# Patient Record
Sex: Male | Born: 1945 | Race: White | Hispanic: No | Smoking: Former smoker
Health system: Southern US, Community
[De-identification: ages and names within clinical notes are randomized; demographics above are authoritative.]

## PROBLEM LIST (undated history)

## (undated) DIAGNOSIS — C439 Malignant melanoma of skin, unspecified: Secondary | ICD-10-CM

## (undated) DIAGNOSIS — Z952 Presence of prosthetic heart valve: Secondary | ICD-10-CM

## (undated) DIAGNOSIS — G4733 Obstructive sleep apnea (adult) (pediatric): Secondary | ICD-10-CM

## (undated) DIAGNOSIS — J449 Chronic obstructive pulmonary disease, unspecified: Secondary | ICD-10-CM

## (undated) DIAGNOSIS — K55069 Acute infarction of intestine, part and extent unspecified: Secondary | ICD-10-CM

## (undated) DIAGNOSIS — I2699 Other pulmonary embolism without acute cor pulmonale: Secondary | ICD-10-CM

## (undated) DIAGNOSIS — K219 Gastro-esophageal reflux disease without esophagitis: Secondary | ICD-10-CM

## (undated) DIAGNOSIS — E669 Obesity, unspecified: Secondary | ICD-10-CM

## (undated) DIAGNOSIS — E1169 Type 2 diabetes mellitus with other specified complication: Secondary | ICD-10-CM

## (undated) DIAGNOSIS — I10 Essential (primary) hypertension: Secondary | ICD-10-CM

## (undated) HISTORY — PX: CHOLECYSTECTOMY: SHX55

## (undated) HISTORY — PX: OTHER SURGICAL HISTORY: SHX169

## (undated) HISTORY — PX: PILONIDAL CYST EXCISION: SHX744

## (undated) HISTORY — PX: AORTIC VALVE REPLACEMENT: SHX41

## (undated) HISTORY — PX: COLON SURGERY: SHX602

## (undated) HISTORY — PX: ARTERIAL THROMBECTOMY: SHX558

---

## 2010-11-01 ENCOUNTER — Emergency Department (HOSPITAL_BASED_OUTPATIENT_CLINIC_OR_DEPARTMENT_OTHER)
Admission: EM | Admit: 2010-11-01 | Discharge: 2010-11-01 | Payer: Self-pay | Source: Home / Self Care | Admitting: Emergency Medicine

## 2017-12-26 DIAGNOSIS — K55069 Acute infarction of intestine, part and extent unspecified: Secondary | ICD-10-CM

## 2017-12-26 HISTORY — DX: Acute infarction of intestine, part and extent unspecified: K55.069

## 2018-01-28 ENCOUNTER — Observation Stay (HOSPITAL_COMMUNITY)
Admission: EM | Admit: 2018-01-28 | Discharge: 2018-01-31 | Disposition: A | Discharge status: Home / Self Care | Payer: Medicare Other | Attending: Internal Medicine | Admitting: Internal Medicine

## 2018-01-28 ENCOUNTER — Encounter (HOSPITAL_COMMUNITY): Payer: Self-pay

## 2018-01-28 DIAGNOSIS — Z87891 Personal history of nicotine dependence: Secondary | ICD-10-CM | POA: Diagnosis not present

## 2018-01-28 DIAGNOSIS — E46 Unspecified protein-calorie malnutrition: Secondary | ICD-10-CM | POA: Diagnosis not present

## 2018-01-28 DIAGNOSIS — Z7901 Long term (current) use of anticoagulants: Secondary | ICD-10-CM | POA: Insufficient documentation

## 2018-01-28 DIAGNOSIS — G4733 Obstructive sleep apnea (adult) (pediatric): Secondary | ICD-10-CM | POA: Diagnosis not present

## 2018-01-28 DIAGNOSIS — K909 Intestinal malabsorption, unspecified: Secondary | ICD-10-CM | POA: Diagnosis not present

## 2018-01-28 DIAGNOSIS — R531 Weakness: Secondary | ICD-10-CM | POA: Insufficient documentation

## 2018-01-28 DIAGNOSIS — Z79899 Other long term (current) drug therapy: Secondary | ICD-10-CM | POA: Diagnosis not present

## 2018-01-28 DIAGNOSIS — E669 Obesity, unspecified: Secondary | ICD-10-CM

## 2018-01-28 DIAGNOSIS — D649 Anemia, unspecified: Secondary | ICD-10-CM | POA: Diagnosis not present

## 2018-01-28 DIAGNOSIS — K219 Gastro-esophageal reflux disease without esophagitis: Secondary | ICD-10-CM | POA: Insufficient documentation

## 2018-01-28 DIAGNOSIS — I10 Essential (primary) hypertension: Secondary | ICD-10-CM | POA: Insufficient documentation

## 2018-01-28 DIAGNOSIS — Z86718 Personal history of other venous thrombosis and embolism: Secondary | ICD-10-CM | POA: Diagnosis not present

## 2018-01-28 DIAGNOSIS — J449 Chronic obstructive pulmonary disease, unspecified: Secondary | ICD-10-CM | POA: Diagnosis present

## 2018-01-28 DIAGNOSIS — J90 Pleural effusion, not elsewhere classified: Secondary | ICD-10-CM | POA: Insufficient documentation

## 2018-01-28 DIAGNOSIS — Z86711 Personal history of pulmonary embolism: Secondary | ICD-10-CM | POA: Diagnosis not present

## 2018-01-28 DIAGNOSIS — E785 Hyperlipidemia, unspecified: Secondary | ICD-10-CM | POA: Diagnosis not present

## 2018-01-28 DIAGNOSIS — K55069 Acute infarction of intestine, part and extent unspecified: Secondary | ICD-10-CM

## 2018-01-28 DIAGNOSIS — Z952 Presence of prosthetic heart valve: Secondary | ICD-10-CM | POA: Diagnosis not present

## 2018-01-28 DIAGNOSIS — J42 Unspecified chronic bronchitis: Secondary | ICD-10-CM

## 2018-01-28 DIAGNOSIS — Z7982 Long term (current) use of aspirin: Secondary | ICD-10-CM | POA: Diagnosis not present

## 2018-01-28 DIAGNOSIS — Z85820 Personal history of malignant melanoma of skin: Secondary | ICD-10-CM | POA: Insufficient documentation

## 2018-01-28 DIAGNOSIS — E440 Moderate protein-calorie malnutrition: Secondary | ICD-10-CM

## 2018-01-28 DIAGNOSIS — E876 Hypokalemia: Secondary | ICD-10-CM

## 2018-01-28 DIAGNOSIS — E119 Type 2 diabetes mellitus without complications: Secondary | ICD-10-CM | POA: Insufficient documentation

## 2018-01-28 DIAGNOSIS — R262 Difficulty in walking, not elsewhere classified: Secondary | ICD-10-CM | POA: Insufficient documentation

## 2018-01-28 DIAGNOSIS — Z885 Allergy status to narcotic agent status: Secondary | ICD-10-CM | POA: Insufficient documentation

## 2018-01-28 DIAGNOSIS — E1169 Type 2 diabetes mellitus with other specified complication: Secondary | ICD-10-CM | POA: Diagnosis present

## 2018-01-28 DIAGNOSIS — D735 Infarction of spleen: Secondary | ICD-10-CM | POA: Diagnosis not present

## 2018-01-28 HISTORY — DX: Gastro-esophageal reflux disease without esophagitis: K21.9

## 2018-01-28 HISTORY — DX: Acute infarction of intestine, part and extent unspecified: K55.069

## 2018-01-28 HISTORY — DX: Essential (primary) hypertension: I10

## 2018-01-28 HISTORY — DX: Type 2 diabetes mellitus with other specified complication: E11.69

## 2018-01-28 HISTORY — DX: Other pulmonary embolism without acute cor pulmonale: I26.99

## 2018-01-28 HISTORY — DX: Obstructive sleep apnea (adult) (pediatric): G47.33

## 2018-01-28 HISTORY — DX: Obesity, unspecified: E66.9

## 2018-01-28 HISTORY — DX: Presence of prosthetic heart valve: Z95.2

## 2018-01-28 HISTORY — DX: Chronic obstructive pulmonary disease, unspecified: J44.9

## 2018-01-28 HISTORY — DX: Malignant melanoma of skin, unspecified: C43.9

## 2018-01-28 MED ORDER — ONDANSETRON HCL 4 MG/2ML IJ SOLN
4.0000 mg | Freq: Once | INTRAMUSCULAR | Status: AC
  Administered 2018-01-29: 4 mg via INTRAVENOUS
  Filled 2018-01-28: qty 2

## 2018-01-28 NOTE — ED Provider Notes (Signed)
[facility] DEPT Provider Note   CSN: [id number] Arrival date & time: 01/28/18  2259     History   Chief Complaint Chief Complaint  Patient presents with  . Nausea  . lethargic    HPI Lachlan Mckim is a 72 y.o. male.  The history is provided by the patient.  He was discharged from 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  He had been at a nursing home undergoing rehabilitation.  His wife noted some gradual onset of weakness 2 days ago which got significantly worse today.  He is complaining of nausea today.  He has had some chills but no fever or sweats.  He has not had any vomiting.  There has been fairly constant diarrhea since he had been admitted to the hospital, and this is not changed.  He denies any abdominal pain.  Wife is also noted that he has a tremor which seems to have gotten worse.  He is very unsteady when using his walker, and this is clearly worse today.  He had routine lab work done at the nursing home, and was sent here because his calcium came back very low - 6.1.  History reviewed. No pertinent past medical history.  There are no active problems to display for this patient.   History reviewed.  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      Home Medications    Prior to Admission medications   Not on File    Family History History reviewed. No pertinent family history.  Social History Social History   Tobacco Use  . Smoking status: Never Smoker  . Smokeless tobacco: Never Used  Substance Use Topics  . Alcohol use: Never    Frequency: Never  . Drug use: Never     Allergies   Patient has no allergy information on record.   Review of Systems Review of Systems  All other systems reviewed and are negative.    Physical Exam Updated Vital Signs BP 115/69 (BP Location: Left Arm)   Pulse 76   Temp 98.9 F (37.2  C) (Oral)   Resp (!) 33   SpO2 96%   Physical Exam  Nursing note and vitals reviewed.  72 year old male, resting comfortably and in no acute distress. Vital signs are significant for rapid respiratory rate. Oxygen saturation is 96%, which is normal. Head is normocephalic and atraumatic. PERRLA, EOMI. Oropharynx is clear. Neck is nontender and supple without adenopathy or JVD. Back is nontender and there is no CVA tenderness. Lungs are clear without rales, wheezes, or rhonchi. Chest is nontender. Heart has regular rate and rhythm without murmur. Abdomen is soft, flat, nontender without masses or hepatosplenomegaly and peristalsis is normoactive.  Midline surgical scar is present with dressing in place-dressing not removed. Extremities have 2+ edema, full range of motion is present. Skin is warm and dry without rash. Neurologic: Mental status is normal, cranial nerves are intact, there are no motor or sensory deficits.  Mild tremor and mild cogwheel rigidity noted.  Chvostek's sign not present.  ED Treatments / Results  Labs (all labs ordered are listed, but only abnormal results are displayed) Labs Reviewed  COMPREHENSIVE METABOLIC PANEL - Abnormal; Notable for the following components:      Result Value   CO2 19 (*)    Calcium 6.2 (*)    Total Protein 5.3 (*)    Albumin 2.3 (*)  AST 54 (*)    Total Bilirubin 1.3 (*)    All other components within normal limits  CBC WITH DIFFERENTIAL/PLATELET - Abnormal; Notable for the following components:   RBC 3.63 (*)    Hemoglobin 9.9 (*)    HCT 30.7 (*)    RDW 15.6 (*)    All other components within normal limits  MAGNESIUM - Abnormal; Notable for the following components:   Magnesium 0.3 (*)    All other components within normal limits  URINALYSIS, ROUTINE W REFLEX MICROSCOPIC  CALCIUM, IONIZED  MAGNESIUM  CALCIUM  I-STAT CG4 LACTIC ACID, ED  I-STAT CG4 LACTIC ACID, ED   Radiology No results found.  Procedures Procedures    CRITICAL CARE Performed by: Delora Fuel Total critical care time: 45 minutes Critical care time was exclusive of separately billable procedures and treating other patients. Critical care was necessary to treat or prevent imminent or life-threatening deterioration. Critical care was time spent personally by me on the following activities: development of treatment plan with patient and/or surrogate as well as nursing, discussions with consultants, evaluation of patient's response to treatment, examination of patient, obtaining history from patient or surrogate, ordering and performing treatments and interventions, ordering and review of laboratory studies, ordering and review of radiographic studies, pulse oximetry and re-evaluation of patient's condition.  Medications Ordered in ED Medications  iopamidol (ISOVUE-370) 76 % injection (has no administration in time range)  ondansetron (ZOFRAN) injection 4 mg (4 mg Intravenous Given 01/29/18 0034)  magnesium sulfate IVPB 2 g 50 mL (0 g Intravenous Stopped 01/29/18 0309)  magnesium sulfate IVPB 2 g 50 mL (0 g Intravenous Stopped 01/29/18 0443)  iopamidol (ISOVUE-370) 76 % injection 100 mL (100 mLs Intravenous Contrast Given 01/29/18 0700)  calcium gluconate 1 g in sodium chloride 0.9 % 100 mL IVPB (1 g Intravenous New Bag/Given 01/29/18 0725)     Initial Impression / Assessment and Plan / ED Course  I have reviewed the triage vital signs and the nursing notes.  Pertinent labs & imaging results that were available during my care of the patient were reviewed by me and considered in my medical decision making (see chart for details).  Weakness and hypocalcemia.  Old records are reviewed including records from [facility]  He did have hypocalcemia there with calcium of 7.4.  I cannot find report of albumin.  He does not show clinical signs of hypocalcemia, so I suspect that this is related to hypoalbuminemia.  Will check conference of  metabolic panel.  We will also check urinalysis to make sure he does not have occult urinary tract infection to account for his weakness.  Albumin is low at 2.3, which would make his calcium level still slightly low but not dangerously low.  Magnesium is come back extremely low at 0.3.  He is given 2 rounds of intravenous magnesium.  Urinalysis shows no evidence of infection.  He is being sent for CT angiogram which was scheduled to be done at [facility] today.  The scan has been completed, but interpretation is still pending.  He is also getting some IV calcium.  We will repeat calcium and magnesium levels after infusions have been completed.  Case is signed out to Dr. Eulis Foster to evaluate those results.  Patient also states that he would prefer to return home rather than to the nursing care facility.  We will ask case management to help set up home health needs including home rehabilitation sessions.  Final Clinical Impressions(s) /  ED Diagnoses   Final diagnoses:  Hypocalcemia  Hypomagnesemia  Weakness  Normochromic normocytic anemia    ED Discharge Orders    None       Delora Fuel, MD 49/17/91 [phone]

## 2018-01-28 NOTE — ED Triage Notes (Signed)
Pt from a facility and they say that he's been lethargic and had abnormal labs

## 2018-01-29 ENCOUNTER — Other Ambulatory Visit: Payer: Self-pay

## 2018-01-29 ENCOUNTER — Emergency Department (HOSPITAL_COMMUNITY): Payer: Medicare Other

## 2018-01-29 ENCOUNTER — Encounter (HOSPITAL_COMMUNITY): Payer: Self-pay

## 2018-01-29 DIAGNOSIS — K219 Gastro-esophageal reflux disease without esophagitis: Secondary | ICD-10-CM | POA: Insufficient documentation

## 2018-01-29 DIAGNOSIS — E1169 Type 2 diabetes mellitus with other specified complication: Secondary | ICD-10-CM | POA: Diagnosis not present

## 2018-01-29 DIAGNOSIS — R531 Weakness: Secondary | ICD-10-CM

## 2018-01-29 DIAGNOSIS — G4733 Obstructive sleep apnea (adult) (pediatric): Secondary | ICD-10-CM | POA: Diagnosis present

## 2018-01-29 DIAGNOSIS — I10 Essential (primary) hypertension: Secondary | ICD-10-CM | POA: Diagnosis present

## 2018-01-29 DIAGNOSIS — J449 Chronic obstructive pulmonary disease, unspecified: Secondary | ICD-10-CM | POA: Diagnosis present

## 2018-01-29 DIAGNOSIS — E876 Hypokalemia: Secondary | ICD-10-CM

## 2018-01-29 DIAGNOSIS — E669 Obesity, unspecified: Secondary | ICD-10-CM

## 2018-01-29 DIAGNOSIS — J42 Unspecified chronic bronchitis: Secondary | ICD-10-CM | POA: Diagnosis not present

## 2018-01-29 LAB — COMPREHENSIVE METABOLIC PANEL
ALT: 30 U/L (ref 17–63)
AST: 54 U/L — ABNORMAL HIGH (ref 15–41)
Albumin: 2.3 g/dL — ABNORMAL LOW (ref 3.5–5.0)
Alkaline Phosphatase: 74 U/L (ref 38–126)
Anion gap: 9 (ref 5–15)
BUN: 8 mg/dL (ref 6–20)
CO2: 19 mmol/L — ABNORMAL LOW (ref 22–32)
Calcium: 6.2 mg/dL — CL (ref 8.9–10.3)
Chloride: 111 mmol/L (ref 101–111)
Creatinine, Ser: 0.94 mg/dL (ref 0.61–1.24)
GFR calc Af Amer: >60 mL/min (ref >60)
GFR calc non Af Amer: >60 mL/min (ref >60)
Glucose, Bld: 89 mg/dL (ref 65–99)
Potassium: 3.5 mmol/L (ref 3.5–5.1)
Sodium: 139 mmol/L (ref 135–145)
Total Bilirubin: 1.3 mg/dL — ABNORMAL HIGH (ref 0.3–1.2)
Total Protein: 5.3 g/dL — ABNORMAL LOW (ref 6.5–8.1)

## 2018-01-29 LAB — CBC WITH DIFFERENTIAL/PLATELET
Basophils Absolute: 0 10*3/uL (ref 0.0–0.1)
Basophils Relative: 0 %
Eosinophils Absolute: 0.1 10*3/uL (ref 0.0–0.7)
Eosinophils Relative: 2 %
HCT: 30.7 % — ABNORMAL LOW (ref 39.0–52.0)
Hemoglobin: 9.9 g/dL — ABNORMAL LOW (ref 13.0–17.0)
Lymphocytes Relative: 15 %
Lymphs Abs: 0.9 10*3/uL (ref 0.7–4.0)
MCH: 27.3 pg (ref 26.0–34.0)
MCHC: 32.2 g/dL (ref 30.0–36.0)
MCV: 84.6 fL (ref 78.0–100.0)
Monocytes Absolute: 0.7 10*3/uL (ref 0.1–1.0)
Monocytes Relative: 11 %
Neutro Abs: 4.2 10*3/uL (ref 1.7–7.7)
Neutrophils Relative %: 72 %
Platelets: 249 10*3/uL (ref 150–400)
RBC: 3.63 MIL/uL — ABNORMAL LOW (ref 4.22–5.81)
RDW: 15.6 % — ABNORMAL HIGH (ref 11.5–15.5)
WBC: 5.8 10*3/uL (ref 4.0–10.5)

## 2018-01-29 LAB — C DIFFICILE QUICK SCREEN W PCR REFLEX
C DIFFICILE (CDIFF) INTERP: NOT DETECTED
C Diff antigen: NEGATIVE
C Diff toxin: NEGATIVE

## 2018-01-29 LAB — I-STAT CHEM 8, ED
BUN: 5 mg/dL — AB (ref 6–20)
CALCIUM ION: 0.89 mmol/L — AB (ref 1.15–1.40)
Chloride: 108 mmol/L (ref 101–111)
Creatinine, Ser: 0.8 mg/dL (ref 0.61–1.24)
Glucose, Bld: 123 mg/dL — ABNORMAL HIGH (ref 65–99)
HEMATOCRIT: 27 % — AB (ref 39.0–52.0)
Hemoglobin: 9.2 g/dL — ABNORMAL LOW (ref 13.0–17.0)
POTASSIUM: 2.8 mmol/L — AB (ref 3.5–5.1)
Sodium: 143 mmol/L (ref 135–145)
TCO2: 21 mmol/L — ABNORMAL LOW (ref 22–32)

## 2018-01-29 LAB — CALCIUM: Calcium: 6.5 mg/dL — ABNORMAL LOW (ref 8.9–10.3)

## 2018-01-29 LAB — URINALYSIS, ROUTINE W REFLEX MICROSCOPIC
Bilirubin Urine: NEGATIVE
Glucose, UA: NEGATIVE mg/dL
Hgb urine dipstick: NEGATIVE
Ketones, ur: NEGATIVE mg/dL
Leukocytes, UA: NEGATIVE
Nitrite: NEGATIVE
Protein, ur: NEGATIVE mg/dL
Specific Gravity, Urine: 1.009 (ref 1.005–1.030)
pH: 6 (ref 5.0–8.0)

## 2018-01-29 LAB — OCCULT BLOOD X 1 CARD TO LAB, STOOL: FECAL OCCULT BLD: NEGATIVE

## 2018-01-29 LAB — I-STAT CG4 LACTIC ACID, ED
Lactic Acid, Venous: 0.95 mmol/L (ref 0.5–1.9)
Lactic Acid, Venous: 1.09 mmol/L (ref 0.5–1.9)

## 2018-01-29 LAB — MAGNESIUM
Magnesium: 0.3 mg/dL — CL (ref 1.7–2.4)
Magnesium: 0.9 mg/dL — CL (ref 1.7–2.4)

## 2018-01-29 LAB — MRSA PCR SCREENING: MRSA BY PCR: NEGATIVE

## 2018-01-29 LAB — GLUCOSE, CAPILLARY
Glucose-Capillary: 112 mg/dL — ABNORMAL HIGH (ref 65–99)
Glucose-Capillary: 95 mg/dL (ref 65–99)

## 2018-01-29 LAB — PHOSPHORUS: Phosphorus: 3.4 mg/dL (ref 2.5–4.6)

## 2018-01-29 MED ORDER — MAGNESIUM SULFATE 4 GM/100ML IV SOLN
4.0000 g | Freq: Once | INTRAVENOUS | Status: AC
  Administered 2018-01-29: 4 g via INTRAVENOUS
  Filled 2018-01-29: qty 100

## 2018-01-29 MED ORDER — PANTOPRAZOLE SODIUM 40 MG PO TBEC: 40.0000 mg | DELAYED_RELEASE_TABLET | Freq: Every day | ORAL | Status: DC

## 2018-01-29 MED ORDER — FAMOTIDINE 20 MG PO TABS
20.0000 mg | ORAL_TABLET | Freq: Two times a day (BID) | ORAL | Status: DC
  Administered 2018-01-29 – 2018-01-31 (×4): 20 mg via ORAL
  Filled 2018-01-29 (×4): qty 1

## 2018-01-29 MED ORDER — GABAPENTIN 100 MG PO CAPS: 100.0000 mg | ORAL_CAPSULE | Freq: Three times a day (TID) | ORAL | Status: DC

## 2018-01-29 MED ORDER — PSYLLIUM 95 % PO PACK
1.0000 | PACK | Freq: Two times a day (BID) | ORAL | Status: DC
  Administered 2018-01-29 – 2018-01-31 (×4): 1 via ORAL
  Filled 2018-01-29 (×4): qty 1

## 2018-01-29 MED ORDER — ASPIRIN EC 325 MG PO TBEC
325.0000 mg | DELAYED_RELEASE_TABLET | Freq: Every day | ORAL | Status: DC
  Administered 2018-01-29 – 2018-01-31 (×3): 325 mg via ORAL
  Filled 2018-01-29 (×3): qty 1

## 2018-01-29 MED ORDER — POTASSIUM CHLORIDE 10 MEQ/100ML IV SOLN
10.0000 meq | INTRAVENOUS | Status: AC
  Administered 2018-01-29: 10 meq via INTRAVENOUS
  Filled 2018-01-29 (×2): qty 100

## 2018-01-29 MED ORDER — CLOBETASOL PROPIONATE 0.05 % EX CREA
1.0000 "application " | TOPICAL_CREAM | Freq: Every day | CUTANEOUS | Status: DC | PRN
  Filled 2018-01-29: qty 15

## 2018-01-29 MED ORDER — SODIUM CHLORIDE 0.9 % IV SOLN
4.0000 g | Freq: Once | INTRAVENOUS | Status: AC
  Administered 2018-01-29: 4 g via INTRAVENOUS
  Filled 2018-01-29: qty 40

## 2018-01-29 MED ORDER — MAGNESIUM SULFATE 2 GM/50ML IV SOLN
2.0000 g | Freq: Once | INTRAVENOUS | Status: AC
  Administered 2018-01-29: 2 g via INTRAVENOUS
  Filled 2018-01-29: qty 50

## 2018-01-29 MED ORDER — LORATADINE 10 MG PO TABS
10.0000 mg | ORAL_TABLET | Freq: Every day | ORAL | Status: DC
  Administered 2018-01-29 – 2018-01-31 (×3): 10 mg via ORAL
  Filled 2018-01-29 (×3): qty 1

## 2018-01-29 MED ORDER — OXYCODONE-ACETAMINOPHEN 5-325 MG PO TABS: 1.0000 | ORAL_TABLET | Freq: Two times a day (BID) | ORAL | Status: DC | PRN

## 2018-01-29 MED ORDER — METOPROLOL TARTRATE 25 MG PO TABS
12.5000 mg | ORAL_TABLET | Freq: Two times a day (BID) | ORAL | Status: DC
  Administered 2018-01-29 – 2018-01-31 (×5): 12.5 mg via ORAL
  Filled 2018-01-29 (×5): qty 1

## 2018-01-29 MED ORDER — FUROSEMIDE 20 MG PO TABS
20.0000 mg | ORAL_TABLET | Freq: Every day | ORAL | Status: DC
  Administered 2018-01-29 – 2018-01-31 (×3): 20 mg via ORAL
  Filled 2018-01-29 (×3): qty 1

## 2018-01-29 MED ORDER — POTASSIUM CHLORIDE 10 MEQ/100ML IV SOLN
10.0000 meq | Freq: Once | INTRAVENOUS | Status: AC
  Administered 2018-01-29: 10 meq via INTRAVENOUS

## 2018-01-29 MED ORDER — B COMPLEX-C PO TABS
1.0000 | ORAL_TABLET | Freq: Every day | ORAL | Status: DC
  Administered 2018-01-30 – 2018-01-31 (×2): 1 via ORAL
  Filled 2018-01-29 (×2): qty 1

## 2018-01-29 MED ORDER — APIXABAN 5 MG PO TABS
5.0000 mg | ORAL_TABLET | Freq: Two times a day (BID) | ORAL | Status: DC
  Administered 2018-01-29 – 2018-01-31 (×5): 5 mg via ORAL
  Filled 2018-01-29 (×6): qty 1

## 2018-01-29 MED ORDER — METFORMIN HCL 500 MG PO TABS: 500.0000 mg | ORAL_TABLET | Freq: Two times a day (BID) | ORAL | Status: DC

## 2018-01-29 MED ORDER — DOCUSATE SODIUM 100 MG PO CAPS: 100.0000 mg | ORAL_CAPSULE | Freq: Every day | ORAL | Status: DC | PRN

## 2018-01-29 MED ORDER — LOSARTAN POTASSIUM 50 MG PO TABS: 100.0000 mg | ORAL_TABLET | Freq: Every day | ORAL | Status: DC

## 2018-01-29 MED ORDER — IOPAMIDOL (ISOVUE-370) INJECTION 76%
INTRAVENOUS | Status: AC
  Filled 2018-01-29: qty 100

## 2018-01-29 MED ORDER — MOMETASONE FURO-FORMOTEROL FUM 200-5 MCG/ACT IN AERO
2.0000 | INHALATION_SPRAY | Freq: Two times a day (BID) | RESPIRATORY_TRACT | Status: DC
  Administered 2018-01-30 – 2018-01-31 (×3): 2 via RESPIRATORY_TRACT
  Filled 2018-01-29 (×2): qty 8.8

## 2018-01-29 MED ORDER — VITAMIN D3 25 MCG (1000 UNIT) PO TABS
1000.0000 [IU] | ORAL_TABLET | Freq: Every day | ORAL | Status: DC
  Administered 2018-01-30 – 2018-01-31 (×2): 1000 [IU] via ORAL
  Filled 2018-01-29 (×2): qty 1

## 2018-01-29 MED ORDER — SODIUM CHLORIDE 0.9 % IV SOLN
1.0000 g | Freq: Once | INTRAVENOUS | Status: AC
  Administered 2018-01-29: 1 g via INTRAVENOUS
  Filled 2018-01-29: qty 10

## 2018-01-29 MED ORDER — ATORVASTATIN CALCIUM 20 MG PO TABS
20.0000 mg | ORAL_TABLET | Freq: Every evening | ORAL | Status: DC
  Administered 2018-01-29 – 2018-01-30 (×2): 20 mg via ORAL
  Filled 2018-01-29 (×2): qty 1

## 2018-01-29 MED ORDER — MAGNESIUM OXIDE 400 (241.3 MG) MG PO TABS
400.0000 mg | ORAL_TABLET | Freq: Three times a day (TID) | ORAL | Status: DC
  Administered 2018-01-29 (×2): 400 mg via ORAL
  Filled 2018-01-29 (×2): qty 1

## 2018-01-29 MED ORDER — ADULT MULTIVITAMIN W/MINERALS CH
1.0000 | ORAL_TABLET | Freq: Every day | ORAL | Status: DC
  Administered 2018-01-29 – 2018-01-31 (×3): 1 via ORAL
  Filled 2018-01-29 (×3): qty 1

## 2018-01-29 MED ORDER — IOPAMIDOL (ISOVUE-370) INJECTION 76%
100.0000 mL | Freq: Once | INTRAVENOUS | Status: AC | PRN
  Administered 2018-01-29: 100 mL via INTRAVENOUS

## 2018-01-29 MED ORDER — POTASSIUM CHLORIDE CRYS ER 20 MEQ PO TBCR
40.0000 meq | EXTENDED_RELEASE_TABLET | ORAL | Status: AC
Start: 2018-01-29 — End: 2018-01-30
  Administered 2018-01-29 – 2018-01-30 (×3): 40 meq via ORAL
  Filled 2018-01-29 (×3): qty 2

## 2018-01-29 MED ORDER — INSULIN ASPART 100 UNIT/ML ~~LOC~~ SOLN
0.0000 [IU] | Freq: Three times a day (TID) | SUBCUTANEOUS | Status: DC
  Administered 2018-01-30: 1 [IU] via SUBCUTANEOUS

## 2018-01-29 MED ORDER — FENOFIBRATE 160 MG PO TABS
160.0000 mg | ORAL_TABLET | Freq: Every day | ORAL | Status: DC
  Administered 2018-01-29 – 2018-01-31 (×3): 160 mg via ORAL
  Filled 2018-01-29 (×3): qty 1

## 2018-01-29 MED ORDER — ONDANSETRON HCL 4 MG PO TABS: 4.0000 mg | ORAL_TABLET | Freq: Four times a day (QID) | ORAL | Status: DC | PRN

## 2018-01-29 MED ORDER — GLUCERNA SHAKE PO LIQD
237.0000 mL | Freq: Three times a day (TID) | ORAL | Status: DC
  Administered 2018-01-29 – 2018-01-30 (×3): 237 mL via ORAL
  Filled 2018-01-29 (×6): qty 237

## 2018-01-29 MED ORDER — MAGNESIUM SULFATE 2 GM/50ML IV SOLN
2.0000 g | Freq: Once | INTRAVENOUS | Status: AC
Start: 2018-01-29 — End: 2018-01-29
  Administered 2018-01-29: 2 g via INTRAVENOUS
  Filled 2018-01-29: qty 50

## 2018-01-29 MED ORDER — ENSURE ENLIVE PO LIQD: 237.0000 mL | Freq: Two times a day (BID) | ORAL | Status: DC

## 2018-01-29 MED ORDER — CO Q10 200 MG PO CAPS: 1.0000 | ORAL_CAPSULE | Freq: Every day | ORAL | Status: DC

## 2018-01-29 MED ORDER — GABAPENTIN 100 MG PO CAPS
100.0000 mg | ORAL_CAPSULE | Freq: Three times a day (TID) | ORAL | Status: DC
  Administered 2018-01-29 (×2): 100 mg via ORAL
  Filled 2018-01-29 (×2): qty 1

## 2018-01-29 MED ORDER — CHOLESTYRAMINE 4 G PO PACK
4.0000 g | PACK | Freq: Two times a day (BID) | ORAL | Status: DC
  Administered 2018-01-29 – 2018-01-30 (×2): 4 g via ORAL
  Filled 2018-01-29 (×3): qty 1

## 2018-01-29 MED ORDER — ACETAMINOPHEN 500 MG PO TABS: 1000.0000 mg | ORAL_TABLET | Freq: Two times a day (BID) | ORAL | Status: DC | PRN

## 2018-01-29 MED ORDER — LOPERAMIDE HCL 2 MG PO CAPS
4.0000 mg | ORAL_CAPSULE | Freq: Two times a day (BID) | ORAL | Status: DC
  Administered 2018-01-29: 4 mg via ORAL
  Filled 2018-01-29: qty 2

## 2018-01-29 MED ORDER — LOPERAMIDE HCL 2 MG PO CAPS
2.0000 mg | ORAL_CAPSULE | ORAL | Status: DC | PRN
  Administered 2018-01-29: 2 mg via ORAL
  Filled 2018-01-29: qty 1

## 2018-01-29 NOTE — Care Management Note (Signed)
Case Management Note  CM consulted for possible [facility] services.  Pt is currently in SNF rehab at [facility].  He has been there for the last 10 days after a 12 day hospital admission at [place] and spouse are requesting to return home with [facility] and additional assistance instead of returning to the SNF.    CM contacted Peggy at [place] who advised the pt's expected D/C date has been changed to 02/05/2018 per ins authorization due to his increasing weakness and continued diarrhea.  CM spoke with pt and spouse at bedside.  CM discussed pt's current needs.  He reports he is ambulatory with a walker, sleeps with O2 and a CPAP at night and has to wear O2  PRN currently since he is so weak, he may fall asleep.  CM advised them that I had spoken with [facility] to ask what the current D/C plan/ETA is and advised them that if insurance was willing to extend his SNF rehab, he likely needed that level of care.  When CM asked why they did not want to go back to the SNF, they report his nutrition is not adequate and that he is not getting as much PT as they believe he should.  CM discussed possible reasons why the pt's diet is not meeting their expectations and possible ways to correct the problem including a change to the diet order from the doctor.  CM additionally advised them that the amount of PT provided to the pt is based on insurance, not the PT/OT teams schedules.  CM also advised they could go back to [facility] and try to correct the issues they feel he is having, they could choose to go to a different SNF facility from [facility], or they could pay privately for [facility] along with [facility] services so the pt's spouse could work.  CM encouraged pt and spouse to consider their options while waiting on blood work results and that the choice is his.  They requested that CM start the process for [facility] and PCS through the [place] but they had not made a final decision.  CM contacted Frederich Chick at [place],  [place] ext [phone], to discuss pt's situation.  She advised they would need further information from [facility] and from [facility] in order for the pt's PCP to write [place] orders and for them to make a referral for PCS to another part of the [place].  CM faxed requested information to Dr. Arletha Grippe, Joylene Igo [phone] and advised her CM would call back once pt's plan of care was determined.  She noted that pt's main CSW is Horald Chestnut and can be reached at [phone] ext [phone].  CM contacted Peggy with [facility] to request information sent to the [facility] faxed with attention to Dr. Arletha Grippe. She reports she will fax information to the [place].  Dr. Eulis Foster advised pt was being admitted with hypocalcemia.  Updated Peggy at [employer] and [place] at [place].    CM spoke with pt and spouse at bedside to advised that the [facility] and PCS process has been started with the [place] if that is his choice when it is time for him to leave the hospital.  CM discussed that another CM on the inpt floor would continue to work with them for a transition plan.  If pt requests to go home with [facility] and PCS, please fax D/C summary, H&P, and PT/OT notes to Dr. Arletha Grippe and contact the pt's SW at both [facility] and [facility] to notify of  transition care plan.  In process, will continue to follow.

## 2018-01-29 NOTE — Plan of Care (Signed)
  Problem: Education: Goal: Knowledge of General Education information will improve Outcome: Progressing   Problem: Health Behavior/Discharge Planning: Goal: Ability to manage health-related needs will improve Outcome: Progressing   Problem: Clinical Measurements: Goal: Ability to maintain clinical measurements within normal limits will improve Outcome: Progressing   Problem: Nutrition: Goal: Adequate nutrition will be maintained Outcome: Progressing   Problem: Elimination: Goal: Will not experience complications related to urinary retention Outcome: Progressing   Problem: Pain Managment: Goal: General experience of comfort will improve Outcome: Progressing   Problem: Safety: Goal: Ability to remain free from injury will improve Outcome: Progressing   Problem: Skin Integrity: Goal: Risk for impaired skin integrity will decrease Outcome: Progressing

## 2018-01-29 NOTE — ED Notes (Addendum)
RN AND MD NOTIFIED OF PATIENT'S CALCIUM LEVEL 0.89

## 2018-01-29 NOTE — ED Notes (Signed)
ED TO INPATIENT HANDOFF REPORT  Name/Age/Gender Gerald Bullock 72 y.o. male  Code Status Advance Directive Documentation     Most Recent Value  Type of Advance Directive  Healthcare Power of [place], Living will Butch Penny (spouse) HCPOA]  Pre-existing out of facility DNR order (yellow form or pink MOST form)  -  "MOST" Form in Place?  -      Home/SNF/Other home  Chief Complaint Nausea; Lethargic  Level of Care/Admitting Diagnosis ED Disposition    ED Disposition Condition Comment   Admit  Hospital Area: [place] [[id number]]  Level of Care: Telemetry [5]  Admit to tele based on following criteria: Monitor QTC interval  Diagnosis: Hypomagnesemia [270786]  Admitting Physician: Janece Canterbury [phone]  Attending Physician: Janece Canterbury [[id number]]  PT Class (Do Not Modify): Observation [104]  PT Acc Code (Do Not Modify): Observation [10022]       Medical History Past Medical History:  Diagnosis Date  . COPD (chronic obstructive pulmonary disease) ([place])   . Diabetes mellitus type 2 in obese ([place])   . Essential hypertension   . GERD (gastroesophageal reflux disease)   . OSA (obstructive sleep apnea)   . Pulmonary embolism ([place])   . 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6 mg/dL    Comment: Performed at [facility],  [place] [address], [place], [place] [address]  Comprehensive metabolic panel     Status: Abnormal   Collection Time: 01/29/18 12:25 AM  Result Value Ref Range   Sodium 139 135 - 145 mmol/L   Potassium 3.5 3.5 - 5.1 mmol/L   Chloride 111 101 - 111 mmol/L   CO2 19 (L) 22 - 32 mmol/L   Glucose, Bld 89 65 - 99 mg/dL   BUN 8 6 - 20 mg/dL   Creatinine, Ser 0.94 0.61 - 1.24 mg/dL   Calcium 6.2 (LL) 8.9 - 10.3 mg/dL    Comment: CRITICAL RESULT CALLED TO, READ BACK BY AND VERIFIED WITH: Wende Crease @0123  01/29/18 MKELLY    Total Protein 5.3 (L) 6.5 - 8.1 g/dL   Albumin 2.3 (L) 3.5 - 5.0 g/dL   AST 54 (H) 15 - 41 U/L   ALT 30 17 - 63 U/L   Alkaline Phosphatase 74 38 - 126 U/L   Total Bilirubin 1.3 (H) 0.3 - 1.2 mg/dL   GFR calc non Af Amer >60 >60 mL/min   GFR calc Af Amer >60 >60 mL/min    Comment: (NOTE) The eGFR has been calculated using the CKD EPI equation. This calculation has not been validated in all clinical situations. eGFR's persistently <60 mL/min signify possible Chronic Kidney Disease.    Anion gap 9 5 - 15    Comment:  Performed at [facility], [place] [address] [place], [place] [address]  CBC with Differential     Status: Abnormal   Collection Time: 01/29/18 12:25 AM  Result Value Ref Range   WBC 5.8 4.0 - 10.5 K/uL   RBC 3.63 (L) 4.22 - 5.81 MIL/uL   Hemoglobin 9.9 (L) 13.0 - 17.0 g/dL   HCT 30.7 (L) 39.0 - 52.0 %   MCV 84.6 78.0 - 100.0 fL   MCH 27.3 26.0 - 34.0 pg   MCHC 32.2 30.0 - 36.0 g/dL   RDW 15.6 (H) 11.5 - 15.5 %   Platelets 249 150 - 400 K/uL   Neutrophils Relative % 72 %   Neutro Abs 4.2 1.7 - 7.7 K/uL   Lymphocytes Relative 15 %   Lymphs Abs 0.9 0.7 - 4.0 K/uL   Monocytes Relative 11 %   Monocytes Absolute 0.7 0.1 - 1.0 K/uL   Eosinophils Relative 2 %   Eosinophils Absolute 0.1 0.0 - 0.7 K/uL   Basophils Relative 0 %   Basophils Absolute 0.0 0.0 - 0.1 K/uL    Comment: Performed at [facility], [place] [address], [place], [place] [address]  Magnesium     Status: Abnormal   Collection Time: 01/29/18 12:25 AM  Result Value Ref Range   Magnesium 0.3 (LL) 1.7 - 2.4 mg/dL    Comment: CRITICAL RESULT CALLED TO, READ BACK BY AND VERIFIED WITH: Wende Crease @0123  01/29/18 MKELLY Performed at [facility], [place] [address], [place], [place] [address]   I-Stat CG4 Lactic Acid, ED     Status: None   Collection Time: 01/29/18 12:34 AM  Result Value Ref Range   Lactic Acid, Venous 0.95 0.5 - 1.9 mmol/L  I-Stat CG4 Lactic Acid, ED     Status: None   Collection Time: 01/29/18  2:05 AM  Result Value Ref Range   Lactic Acid, Venous 1.09 0.5 - 1.9 mmol/L  Urinalysis, Routine w reflex microscopic     Status: None   Collection Time: 01/29/18  4:41 AM  Result Value Ref Range   Color, Urine YELLOW YELLOW   APPearance CLEAR CLEAR   Specific Gravity, Urine 1.009 1.005 - 1.030   pH 6.0 5.0 - 8.0   Glucose, UA NEGATIVE NEGATIVE mg/dL   Hgb urine dipstick NEGATIVE NEGATIVE   Bilirubin Urine NEGATIVE NEGATIVE   Ketones, ur NEGATIVE NEGATIVE mg/dL   Protein, ur NEGATIVE NEGATIVE mg/dL   Nitrite NEGATIVE NEGATIVE   Leukocytes, UA NEGATIVE NEGATIVE    Comment: Performed at [facility], [place] [address], [place], [place] [address]  Magnesium     Status: Abnormal   Collection Time: 01/29/18  9:28 AM  Result Value Ref Range   Magnesium 0.9 (LL) 1.7 - 2.4 mg/dL    Comment: CRITICAL RESULT CALLED TO, READ BACK BY AND VERIFIED WITH: WEST,S. RN AT 1023 01/29/18 MULLINS,T Performed at [facility], [place] [address] [place], [place] [address]   Calcium     Status: Abnormal   Collection Time: 01/29/18  9:28 AM  Result Value Ref Range   Calcium 6.5 (L) 8.9 - 10.3 mg/dL    Comment: Performed at [facility], [place] [address] [place], [place] [address]  I-stat Chem 8, ED     Status: Abnormal   Collection Time: 01/29/18 11:21 AM  Result Value Ref Range    Sodium 143 135 - 145 mmol/L   Potassium 2.8 (L) 3.5 -  5.1 mmol/L   Chloride 108 101 - 111 mmol/L   BUN 5 (L) 6 - 20 mg/dL   Creatinine, Ser 0.80 0.61 - 1.24 mg/dL   Glucose, Bld 123 (H) 65 - 99 mg/dL   Calcium, Ion 0.89 (LL) 1.15 - 1.40 mmol/L   TCO2 21 (L) 22 - 32 mmol/L   Hemoglobin 9.2 (L) 13.0 - 17.0 g/dL   HCT 27.0 (L) 39.0 - 52.0 %   Comment NOTIFIED PHYSICIAN    Ct Angio Abd/pel W And/or Wo Contrast  Result Date: 01/29/2018 CLINICAL DATA:  72 year old male with lethargy and abdominal pain. History of SMA thrombosis several weeks ago now status post SMA thrombectomy and bowel resection performed at [facility]. EXAM: CT ANGIOGRAPHY ABDOMEN AND PELVIS WITH CONTRAST AND WITHOUT CONTRAST TECHNIQUE: Multidetector CT imaging of the abdomen and pelvis was performed using the standard protocol during bolus administration of intravenous contrast. Multiplanar reconstructed images and MIPs were obtained and reviewed to evaluate the vascular anatomy. CONTRAST:  112m ISOVUE-370 IOPAMIDOL (ISOVUE-370) INJECTION 76% COMPARISON:  Prior CT scan of the abdomen and pelvis 01/06/2018 FINDINGS: VASCULAR Aorta: Mild atherosclerotic vascular calcifications. No evidence of dissection or aneurysm. Celiac: Conventional celiac artery anatomy. 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 The splenic artery is severely narrowed but remains patent. The common hepatic artery appears widely patent. SMA: Interval surgical changes adjacent to the splenic artery with new surgical clips posterior to the pancreatic parenchyma. The thrombus within the main SMA has resolved. There is persistent occlusion of 1 of the jejunal branches which then reconstitutes via collateral flow. Renals: Solitary dominant renal arteries. No significant stenosis, dissection or aneurysm. No changes to suggest fibromuscular  dysplasia. IMA: Patent without evidence of aneurysm, dissection, vasculitis or significant stenosis. Inflow: Patent without evidence of aneurysm, dissection, vasculitis or significant stenosis. Proximal Outflow: Bilateral common femoral and visualized portions of the superficial and profunda femoral arteries are patent without evidence of aneurysm, dissection, vasculitis or significant stenosis. Veins: Widely patent hepatic, portal, renal and visceral veins. No evidence of iliac or caval thrombosis. Review of the MIP images confirms the above findings. NON-VASCULAR Lower chest: Small calcified granuloma in the left lower lobe. Relatively low inspiratory volumes with subsegmental atelectasis bilaterally. Trace pleural effusions. Relative elevation of the left hemidiaphragm which was seen on the prior study. Surgical changes of prior aortic valve replacement. Mild cardiomegaly. No pericardial effusion. Unremarkable visualized distal thoracic esophagus. Hepatobiliary: No focal liver abnormality is seen. Status post cholecystectomy. No biliary dilatation. Pancreas: Unremarkable. No pancreatic ductal dilatation or surrounding inflammatory changes. Spleen: Stable wedge-shaped regions of hypoattenuation compared to the prior imaging consistent with areas of prior splenic infarction. No new lesion or abnormality. Adrenals/Urinary Tract: The adrenal glands are normal. No significant interval change in the appearance of a bilateral circumscribed benign renal cysts. No hydronephrosis or nephrolithiasis. No evidence of renal infarct. Stomach/Bowel: Surgical changes of recent midline incision, small bowel resection and right hemicolectomy with enterocolonic anastomosis. The enterocolonic anastomosis appears patent. There is some residual inspissated ingested material within the colon just beyond the anastomosis. Periampullary and D3 duodenal diverticula. Mild colonic diverticulosis without evidence of active inflammation. No  evidence of bowel obstruction, ileus or focal bowel wall thickening. No pneumatosis. Lymphatic: No suspicious lymphadenopathy. Reproductive: Prostate is unremarkable. Other: Healing midline surgical incision. Unchanged omental fat containing umbilical hernia compared to the preoperative study. Mild inflammatory stranding in the retroperitoneal  fat at the mesenteric root in the region of the SMA is likely postsurgical. Mild inflammatory stranding in the mesentery and omentum in the region of the prior bowel resection. No significant ascites or evidence of focal fluid collection to suggest abscess. Musculoskeletal: No acute fracture or aggressive appearing lytic or blastic osseous lesion. IMPRESSION: VASCULAR 1. Significant interval improvement in the previously noted SMA thrombosis compared to 01/06/2018. The main SMA is now widely patent with evidence of recent open surgical thrombectomy and placement of what appears to be a small jump bypass graft . One of the proximal jejunal branches remains occluded but reconstitutes distally via collateral flow. 2. 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 The splenic and common hepatic arteries remain patent and well opacified. 3.  Aortic Atherosclerosis (ICD10-170.0). 4. Surgical changes of prior aortic valve replacement. NON-VASCULAR 1. Expected surgical changes of distal small bowel resection and right hemicolectomy. There are expected postoperative changes in the associated mesenteries and adjacent omentum without evidence of complication. Specifically, the enterocolonic anastomosis appears widely patent, there is no evidence of obstruction, ileus, focal bowel wall thickening or pneumatosis, and there is no significant free fluid or evidence of intra-abdominal abscess. 2. Mild colonic and duodenal diverticulosis without evidence of active diverticulitis. 3. Healing midline surgical incision.  4. Unchanged omental fat containing umbilical hernia compared to the preoperative study. 5. Stable splenic infarct.  No evidence of new insult. 6. Low inspiratory volumes with scattered subsegmental atelectasis and trace bilateral pleural effusions. 7. Stable cardiomegaly. Signed, Criselda Peaches, MD [facility] Radiology Electronically Signed   By: Jacqulynn Cadet M.D.   On: 01/29/2018 08:30    Pending Labs Unresulted Labs (From admission, onward)   Start     Ordered   01/29/18 1325  VITAMIN D 25 Hydroxy (Vit-D Deficiency, Fractures)  Add-on,   R    Question:  Specimen collection method  Answer:  IV Team=IV Team collect   01/29/18 1324   01/29/18 0126  Calcium, ionized  Once,   R    Question:  Specimen collection method  Answer:  IV Team=IV Team collect   01/29/18 0125      Vitals/Pain Today's Vitals   01/29/18 0830 01/29/18 0900 01/29/18 1000 01/29/18 1130  BP: 103/69 (!) 114/93 114/60 107/66  Pulse: 69 66 65 (!) 53  Resp: (!) 26 (!) 34 (!) 22 (!) 23  Temp:      TempSrc:      SpO2: 96% 95% 91% (!) 80%    Isolation Precautions No active isolations  Medications Medications  iopamidol (ISOVUE-370) 76 % injection (has no administration in time range)  apixaban (ELIQUIS) tablet 5 mg (has no administration in time range)  atorvastatin (LIPITOR) tablet 20 mg (has no administration in time range)  aspirin EC tablet 325 mg (has no administration in time range)  b complex vitamins capsule 1 capsule (has no administration in time range)  mometasone-formoterol (DULERA) 200-5 MCG/ACT inhaler 2 puff (has no administration in time range)  cholecalciferol (VITAMIN D) tablet 1,000 Units (has no administration in time range)  cholestyramine (QUESTRAN) packet 4 g (has no administration in time range)  clobetasol cream (TEMOVATE) 2.12 % 1 application (has no administration in time range)  acetaminophen (TYLENOL) tablet 1,000 mg (has no  administration in time range)  Co Q10 CAPS 1 capsule (has no administration in time range)  docusate sodium (COLACE) capsule 100 mg (has no administration in  time range)  fenofibrate tablet 160 mg (has no administration in time range)  gabapentin (NEURONTIN) capsule 100-200 mg (has no administration in time range)  loperamide (IMODIUM) capsule 2 mg (has no administration in time range)  loratadine (CLARITIN) tablet 10 mg (has no administration in time range)  losartan (COZAAR) tablet 100 mg (has no administration in time range)  magnesium oxide (MAG-OX) tablet 400 mg (has no administration in time range)  metoprolol tartrate (LOPRESSOR) tablet 12.5 mg (has no administration in time range)  multivitamins ther. w/minerals tablet 1 tablet (has no administration in time range)  pantoprazole (PROTONIX) EC tablet 40 mg (has no administration in time range)  ondansetron (ZOFRAN) tablet 4 mg (has no administration in time range)  potassium chloride 10 mEq in 100 mL IVPB (has no administration in time range)  potassium chloride SA (K-DUR,KLOR-CON) CR tablet 40 mEq (has no administration in time range)  magnesium sulfate IVPB 4 g 100 mL (has no administration in time range)  feeding supplement (GLUCERNA SHAKE) (GLUCERNA SHAKE) liquid 237 mL (has no administration in time range)  insulin aspart (novoLOG) injection 0-9 Units (has no administration in time range)  ondansetron (ZOFRAN) injection 4 mg (4 mg Intravenous Given 01/29/18 0034)  magnesium sulfate IVPB 2 g 50 mL (0 g Intravenous Stopped 01/29/18 0309)  magnesium sulfate IVPB 2 g 50 mL (0 g Intravenous Stopped 01/29/18 0443)  iopamidol (ISOVUE-370) 76 % injection 100 mL (100 mLs Intravenous Contrast Given 01/29/18 0700)  calcium gluconate 1 g in sodium chloride 0.9 % 100 mL IVPB (0 g Intravenous Stopped 01/29/18 0927)  magnesium sulfate IVPB 2 g 50 mL (0 g Intravenous Stopped 01/29/18 1346)    Mobility walks

## 2018-01-29 NOTE — ED Provider Notes (Signed)
10:25 AM-he is alert and comfortable at this time he would like to try eating and drinking.  He has wife feel that he would be stable to go home, instead of return to the rehab facility.  He apparently had been due to be discharged, and 2 days.  Oral fluid challenge ordered, labs still pending.  Clinical Course as of Jan 29 1325  Thu Jan 29, 2018  1308   Low hemoglobin.Low potassium and ionized calcium.  I-stat Chem 8, ED(!!) [EW]  1309 Low magnesium  Magnesium(!!) [EW]    Clinical Course User Index [EW] Daleen Bo, MD    EKG Interpretation  Date/Time:  Thursday January 29 2018 12:31:29 EDT Ventricular Rate:  70 PR Interval:    QRS Duration: 105 QT Interval:  408 QTC Calculation: 441 R Axis:   -6 Text Interpretation:  Sinus rhythm Borderline prolonged PR interval Abnormal R-wave progression, early transition No old tracing to compare Confirmed by Daleen Bo [phone]) on 01/29/2018 12:56:13 PM       Patient Vitals for the past 24 hrs:  BP Temp Temp src Pulse Resp SpO2  01/29/18 1130 107/66 - - (!) 53 (!) 23 (!) 80 %  01/29/18 1000 114/60 - - 65 (!) 22 91 %  01/29/18 0900 (!) 114/93 - - 66 (!) 34 95 %  01/29/18 0830 103/69 - - 69 (!) 26 96 %  01/29/18 0630 117/79 - - 72 (!) 31 93 %  01/29/18 0445 113/63 - - 69 (!) 22 96 %  01/29/18 0130 (!) 111/53 - - 73 (!) 32 93 %  01/29/18 0100 117/60 - - 67 (!) 30 95 %  01/29/18 0030 118/66 - - 75 (!) 26 95 %  01/28/18 2330 114/63 - - 77 (!) 22 97 %  01/28/18 2328 115/69 98.9 F (37.2 C) Oral 76 (!) 33 96 %    1:15 PM-Consult complete with Hospitalist. Patient case explained and discussed.  She agrees to admit patient for further evaluation and treatment. Call ended at 1:20 PM   .Critical Care Performed by: Daleen Bo, MD Authorized by: Daleen Bo, MD   Critical care provider statement:    Critical care time (minutes):  40   Critical care start time:  01/29/2018 7:30 AM   Critical care end time:  01/29/2018 1:14 PM  Critical care was necessary to treat or prevent imminent or life-threatening deterioration of the following conditions:  Metabolic crisis     MDM-general malaise with significant hypocalcemia and hypomagnesemia.  Attempt at replenishment in the ED, did not result in significant improvement.  EKG shows normal QT.  He is not showing any musculoskeletal or neurologic irritability, concerning for crisis with hypocalcemia.  Patient needs ongoing parenteral management, and observation, under telemetry, for multiple electrolyte abnormalities.  Nursing Notes Reviewed/ Care Coordinated Applicable Imaging Reviewed Interpretation of Laboratory Data incorporated into ED treatment    ICD-10-CM   1. Hypocalcemia E83.51   2. Hypomagnesemia E83.42   3. Weakness R53.1   4. Normochromic normocytic anemia D64.9   5. Hypokalemia E87.6     Plan: Maylene Roes, MD 01/29/18 1326

## 2018-01-29 NOTE — ED Notes (Signed)
Post op bandage noted

## 2018-01-29 NOTE — ED Notes (Signed)
RN notified of abnormal lab 

## 2018-01-29 NOTE — ED Notes (Signed)
Dr. Roxanne Mins aware that calcium was just started, okay for both labs to be drawn after calcium completed.

## 2018-01-29 NOTE — Progress Notes (Signed)
PHARMACIST - PHYSICIAN ORDER COMMUNICATION  CONCERNING: P&T Medication Policy on Herbal Medications  DESCRIPTION:  This patient's order for:  CoQ10  has been noted.  This product(s) is classified as an "herbal" or natural product. Due to a lack of definitive safety studies or FDA approval, nonstandard manufacturing practices, plus the potential risk of unknown drug-drug interactions while on inpatient medications, the Pharmacy and Therapeutics Committee does not permit the use of "herbal" or natural products of this type within [facility].   ACTION TAKEN: The pharmacy department is unable to verify this order at this time and your patient has been informed of this safety policy. Please reevaluate patient's clinical condition at discharge and address if the herbal or natural product(s) should be resumed at that time.  Gretta Arab PharmD, BCPS 01/29/2018 3:12 PM

## 2018-01-29 NOTE — ED Notes (Signed)
Provider notified delay on calcium gluconate waiting IV team for larger IV in higher location.   CT angio waiting for New IV

## 2018-01-29 NOTE — H&P (Addendum)
History and Physical    Gerald Bullock XNA:[id number] DOB: 08-May-1946 DOA: 01/28/2018  Referring MD/NP/PA: Daleen Bo PCP: [facility]   Patient coming from: home  Chief Complaint: Gerald Bullock, and chills  HPI: Gerald Bullock is a 72 y.o. male with history COPD on CPAP and 3 L home oxygen, diabetes mellitus type 2, hypertension, obstructive sleep apnea, remote history of pulmonary embolism,and recent acute SMA thrombosis with necrotic bowel in March for which he underwent SMA embolectomy via longitudinal arteriotomy, partial small bowel resection, and right-handed colectomy on 3/12 at [facility].  He has an estimated 200 cm of small bowel remaining.  He had copious watery diarrhea after his surgery which is to be expected.  He was discharged to skilled nursing very weak and was able to only walk a few steps.  His strength has improved somewhat but he continues to have watery diarrhea 3-12 times per day despite cholestyramine and Imodium.  For the last 2 days he has had some nausea and chills which is unusual.  He denies vomiting and has not noticed any change to his bowel habits over this time.  He has some abdominal soreness in the epigastric area since surgery which is unchanged.  He is unaware of anybody else sick with these symptoms at his skilled nursing facility.  ED Course: Vital signs blood pressure stable, pulse in the 50s-70s, breathing 20-30 times a minute with oxygen saturations in the mid 90s on room air.  Labs: Severe hypomagnesemia, hypocalcemia, hypokalemia.  I have added on a phosphorus level which is pending.  He received 6 g of magnesium in the emergency department but his magnesium levels remained severely low.  White blood cell count was normal and urinalysis was negative.  Repeat CT angios abdomen and pelvis demonstrated widely patent SMA and only 1 proximal jejunal branch that remained occluded but was reconstituted distally with collateral flow.   There is no vascular abnormality that would extend explain the patient's abdominal discomfort or his weakness.  There are also no non-vascular findings on the study to explain his symptoms.  Review of Systems: Denies fevers, increased shortness of breath, vomiting, increased abdominal pain, dysuria.  He has some bilateral lower extremity neuropathy. Complete 12 point review of systems reviewed with patient and negative except as mentioned above.    Past Medical History:  Diagnosis Date  . COPD (chronic obstructive pulmonary disease) ([place])   . Diabetes mellitus type 2 in obese ([place])   . Essential hypertension   . GERD (gastroesophageal reflux disease)   . History of aortic valve replacement    bovine  . Melanoma ([place])    removed from left arm  . OSA (obstructive sleep apnea)   . Pulmonary embolism ([place])   . 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 AORTIC VALVE REPLACEMENT     bovine  . ARTERIAL THROMBECTOMY     SMA thrombectomy  . CHOLECYSTECTOMY    . COLON SURGERY    . partial small bowel resection    . PILONIDAL CYST EXCISION     in early 20s     reports that he has quit smoking. His smoking use included cigarettes. He has a 72.00 pack-year smoking history. He has never used smokeless tobacco. He reports that he drinks alcohol. He reports that he does not use drugs.  Allergies  Allergen Reactions  . Hydrocodone     Hallucinations, but tolerated codeine before   .  Varenicline     Hallucinations and respiratory distress     Family History  Problem Relation Age of Onset  . Breast cancer Mother   . Lung cancer Mother   . Heart disease Father   . Heart attack Father   . Diabetes Brother   . Hypertension Brother   . COPD Brother   . Heart disease Brother     Prior to Admission medications   Medication Sig Start Date End Date Taking? Authorizing Provider  acetaminophen (TYLENOL) 500 MG tablet Take  1,000 mg by mouth 2 (two) times daily as needed for mild pain.   Yes [provider]  apixaban (ELIQUIS) 5 MG TABS tablet Take 5 mg by mouth 2 (two) times daily.   Yes [provider]  aspirin EC 325 MG tablet Take 325 mg by mouth daily.   Yes [provider]  atorvastatin (LIPITOR) 20 MG tablet Take 20 mg by mouth daily.   Yes [provider]  b complex vitamins capsule Take 1 capsule by mouth daily.   Yes [provider]  budesonide-formoterol (SYMBICORT) 160-4.5 MCG/ACT inhaler Inhale 2 puffs into the lungs 2 (two) times daily.   Yes [provider]  cholecalciferol (VITAMIN D) 1000 units tablet Take 1,000 Units by mouth daily.   Yes [provider]  cholestyramine (QUESTRAN) 4 g packet Take 4 g by mouth 2 (two) times daily.   Yes [provider]  clobetasol cream (TEMOVATE) 4.40 % Apply 1 application topically daily as needed (rash).   Yes [provider]  Coenzyme Q10 (CO Q10) 200 MG CAPS Take 1 capsule by mouth daily.   Yes [provider]  docusate sodium (COLACE) 100 MG capsule Take 100 mg by mouth daily as needed for mild constipation.   Yes [provider]  fenofibrate 160 MG tablet Take 160 mg by mouth daily.   Yes [provider]  furosemide (LASIX) 20 MG tablet Take 20 mg by mouth daily.   Yes [provider]  gabapentin (NEURONTIN) 100 MG capsule Take 100-200 mg by mouth 3 (three) times daily. 200 MG once daily in AM, 100 MG at noon and 100 MG at bedtime   Yes [provider]  loperamide (IMODIUM) 2 MG capsule Take 2 mg by mouth as needed for diarrhea or loose stools.   Yes [provider]  loratadine (CLARITIN) 10 MG tablet Take 10 mg by mouth daily.   Yes [provider]  losartan (COZAAR) 100 MG tablet Take 100 mg by mouth daily.   Yes [provider]  magnesium oxide (MAG-OX) 400 MG tablet Take 400 mg by mouth 3 (three) times  daily.   Yes [provider]  metFORMIN (GLUCOPHAGE) 500 MG tablet Take 500 mg by mouth 2 (two) times daily with a meal.   Yes [provider]  metoprolol tartrate (LOPRESSOR) 25 MG tablet Take 12.5 mg by mouth 2 (two) times daily.   Yes [provider]  Multiple Vitamins-Minerals (MULTIVITAMINS THER. W/MINERALS) TABS tablet Take 1 tablet by mouth daily.   Yes [provider]  omeprazole (PRILOSEC) 40 MG capsule Take 40 mg by mouth daily.   Yes [provider]  ondansetron (ZOFRAN) 4 MG tablet Take 4 mg by mouth every 6 (six) hours as needed for nausea or vomiting.   Yes [provider]  oxyCODONE-acetaminophen (PERCOCET/ROXICET) 5-325 MG tablet Take 1 tablet by mouth 2 (two) times daily as needed for severe pain.   Yes  [provider]  potassium chloride (K-DUR,KLOR-CON) 10 MEQ tablet Take 5 mEq by mouth daily.   Yes [provider]    Physical Exam: Vitals:   01/29/18 1000 01/29/18 1130 01/29/18 1330 01/29/18 1440  BP: 114/60 107/66 (!) 92/55 (!) 110/58  Pulse: 65 (!) 53 65 63  Resp: (!) 22 (!) 23 (!) 31 20  Temp:    97.7 F (36.5 C)  TempSrc:    Oral  SpO2: 91% (!) 80% 96% 97%  Weight:    121.4 kg (267 lb 11.2 oz)  Height:    6\' 2"  (1.88 m)    Constitutional:  NAD, calm, comfortable, mild pallor Eyes: PERRL, lids and conjunctivae normal ENMT:  Moist mucous membranes.  Oropharynx nonerythematous, no exudates.   Neck:  No nuchal rigidity, no masses Respiratory:  Faint rales at bilateral bases, no wheezes or rhonchi Cardiovascular: IRRR, no murmurs / rubs / gallops.  2+ radial pulses. Abdomen:  Normal active bowel sounds, soft, nondistended, mild TTP along superior portion of incision, staples intact with edges well approximated.  No fresh oozing but there is some crusting along incision.   Musculoskeletal: Normal muscle tone and bulk.  No contractures.  1+ pitting bilateral lower extremity edema Skin:  no rashes,  abrasions, or ulcers. Incision pictured below Neurologic:  CN 2-12 grossly intact. Sensation intact to light touch, strength 5/5 throughout Psychiatric:  Alert and oriented x 3. Normal affect.    Labs on Admission: I have personally reviewed following labs and imaging studies  CBC: Recent Labs  Lab 01/29/18 0025 01/29/18 1121  WBC 5.8  --   NEUTROABS 4.2  --   HGB 9.9* 9.2*  HCT 30.7* 27.0*  MCV 84.6  --   PLT 249  --    Basic Metabolic Panel: Recent Labs  Lab 01/29/18 0024 01/29/18 0025 01/29/18 0928 01/29/18 1121  NA  --  139  --  143  K  --  3.5  --  2.8*  CL  --  111  --  108  CO2  --  19*  --   --   GLUCOSE  --  89  --  123*  BUN  --  8  --  5*  CREATININE  --  0.94  --  0.80  CALCIUM  --  6.2* 6.5*  --   MG  --  0.3* 0.9*  --   PHOS 3.4  --   --   --    GFR: Estimated Creatinine Clearance: 119 mL/min (by C-G formula based on SCr of 0.8 mg/dL). Liver Function Tests: Recent Labs  Lab 01/29/18 0025  AST 54*  ALT 30  ALKPHOS 74  BILITOT 1.3*  PROT 5.3*  ALBUMIN 2.3*   No results for input(s): LIPASE, AMYLASE in the last 168 hours. No results for input(s): AMMONIA in the last 168 hours. Coagulation Profile: No results for input(s): INR, PROTIME in the last 168 hours. Cardiac Enzymes: No results for input(s): CKTOTAL, CKMB, CKMBINDEX, TROPONINI in the last 168 hours. BNP (last 3 results) No results for input(s): PROBNP in the last 8760 hours. HbA1C: No results for input(s): HGBA1C in the last 72 hours. CBG: No results for input(s): GLUCAP in the last 168 hours. Lipid Profile: No results for input(s): CHOL, HDL, LDLCALC, TRIG, CHOLHDL, LDLDIRECT in the last 72 hours. Thyroid Function Tests: No results for input(s): TSH, T4TOTAL, FREET4, T3FREE, THYROIDAB in the last 72 hours. Anemia Panel: No results for input(s): VITAMINB12, FOLATE, FERRITIN, TIBC, IRON,  RETICCTPCT in the last 72 hours. Urine analysis:    Component Value Date/Time   COLORURINE  YELLOW 01/29/2018 0441   APPEARANCEUR CLEAR 01/29/2018 0441   LABSPEC 1.009 01/29/2018 0441   PHURINE 6.0 01/29/2018 0441   GLUCOSEU NEGATIVE 01/29/2018 0441   HGBUR NEGATIVE 01/29/2018 0441   BILIRUBINUR NEGATIVE 01/29/2018 0441   KETONESUR NEGATIVE 01/29/2018 0441   PROTEINUR NEGATIVE 01/29/2018 0441   NITRITE NEGATIVE 01/29/2018 0441   LEUKOCYTESUR NEGATIVE 01/29/2018 0441   Sepsis Labs: @LABRCNTIP (procalcitonin:4,lacticidven:4) )No results found for this or any previous visit (from the past 240 hour(s)).   Radiological Exams on Admission: Ct Angio Abd/pel W And/or Wo Contrast  Result Date: 01/29/2018 CLINICAL DATA:  72 year old male with lethargy and abdominal pain. History of SMA thrombosis several weeks ago now status post SMA thrombectomy and bowel resection performed at [facility]. EXAM: CT ANGIOGRAPHY ABDOMEN AND PELVIS WITH CONTRAST AND WITHOUT CONTRAST TECHNIQUE: Multidetector CT imaging of the abdomen and pelvis was performed using the standard protocol during bolus administration of intravenous contrast. Multiplanar reconstructed images and MIPs were obtained and reviewed to evaluate the vascular anatomy. CONTRAST:  121mL ISOVUE-370 IOPAMIDOL (ISOVUE-370) INJECTION 76% COMPARISON:  Prior CT scan of the abdomen and pelvis 01/06/2018 FINDINGS: VASCULAR Aorta: Mild atherosclerotic vascular calcifications. No evidence of dissection or aneurysm. Celiac: Conventional celiac artery anatomy. 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 The splenic artery is severely narrowed but remains patent. The common hepatic artery appears widely patent. SMA: Interval surgical changes adjacent to the splenic artery with new surgical clips posterior to the pancreatic parenchyma. The thrombus within the main SMA has resolved. There is persistent occlusion of 1 of the jejunal branches  which then reconstitutes via collateral flow. Renals: Solitary dominant renal arteries. No significant stenosis, dissection or aneurysm. No changes to suggest fibromuscular dysplasia. IMA: Patent without evidence of aneurysm, dissection, vasculitis or significant stenosis. Inflow: Patent without evidence of aneurysm, dissection, vasculitis or significant stenosis. Proximal Outflow: Bilateral common femoral and visualized portions of the superficial and profunda femoral arteries are patent without evidence of aneurysm, dissection, vasculitis or significant stenosis. Veins: Widely patent hepatic, portal, renal and visceral veins. No evidence of iliac or caval thrombosis. Review of the MIP images confirms the above findings. NON-VASCULAR Lower chest: Small calcified granuloma in the left lower lobe. Relatively low inspiratory volumes with subsegmental atelectasis bilaterally. Trace pleural effusions. Relative elevation of the left hemidiaphragm which was seen on the prior study. Surgical changes of prior aortic valve replacement. Mild cardiomegaly. No pericardial effusion. Unremarkable visualized distal thoracic esophagus. Hepatobiliary: No focal liver abnormality is seen. Status post cholecystectomy. No biliary dilatation. Pancreas: Unremarkable. No pancreatic ductal dilatation or surrounding inflammatory changes. Spleen: Stable wedge-shaped regions of hypoattenuation compared to the prior imaging consistent with areas of prior splenic infarction. No new lesion or abnormality. Adrenals/Urinary Tract: The adrenal glands are normal. No significant interval change in the appearance of a bilateral circumscribed benign renal cysts. No hydronephrosis or nephrolithiasis. No evidence of renal infarct. Stomach/Bowel: Surgical changes of recent midline incision, small bowel resection and right hemicolectomy with enterocolonic anastomosis. The enterocolonic anastomosis appears patent. There is some residual inspissated ingested  material within the colon just beyond the anastomosis. Periampullary and D3 duodenal diverticula. Mild colonic diverticulosis without evidence of active inflammation. No evidence of bowel obstruction, ileus or focal bowel wall thickening. No pneumatosis. Lymphatic: No suspicious lymphadenopathy. Reproductive: Prostate is  unremarkable. Other: Healing midline surgical incision. Unchanged omental fat containing umbilical hernia compared to the preoperative study. Mild inflammatory stranding in the retroperitoneal fat at the mesenteric root in the region of the SMA is likely postsurgical. Mild inflammatory stranding in the mesentery and omentum in the region of the prior bowel resection. No significant ascites or evidence of focal fluid collection to suggest abscess. Musculoskeletal: No acute fracture or aggressive appearing lytic or blastic osseous lesion. IMPRESSION: VASCULAR 1. Significant interval improvement in the previously noted SMA thrombosis compared to 01/06/2018. The main SMA is now widely patent with evidence of recent open surgical thrombectomy and placement of what appears to be a small jump bypass graft . One of the proximal jejunal branches remains occluded but reconstitutes distally via collateral flow. 2. 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 The splenic and common hepatic arteries remain patent and well opacified. 3.  Aortic Atherosclerosis (ICD10-170.0). 4. Surgical changes of prior aortic valve replacement. NON-VASCULAR 1. Expected surgical changes of distal small bowel resection and right hemicolectomy. There are expected postoperative changes in the associated mesenteries and adjacent omentum without evidence of complication. Specifically, the enterocolonic anastomosis appears widely patent, there is no evidence of obstruction, ileus, focal bowel wall thickening or pneumatosis, and there is no significant free  fluid or evidence of intra-abdominal abscess. 2. Mild colonic and duodenal diverticulosis without evidence of active diverticulitis. 3. Healing midline surgical incision. 4. Unchanged omental fat containing umbilical hernia compared to the preoperative study. 5. Stable splenic infarct.  No evidence of new insult. 6. Low inspiratory volumes with scattered subsegmental atelectasis and trace bilateral pleural effusions. 7. Stable cardiomegaly. Signed, Criselda Peaches, MD [facility] Radiology Electronically Signed   By: Jacqulynn Cadet M.D.   On: 01/29/2018 08:30    EKG: Independently reviewed.  Normal sinus rhythm, no acute ischemia.  QTC was normal.  Assessment/Plan Principal Problem:   Hypomagnesemia Active Problems:   Hypokalemia   Hypocalcemia   Generalized weakness   Superior mesenteric artery thrombosis ([facility])   OSA (obstructive sleep apnea)   Essential hypertension   Diabetes mellitus type 2 in obese ([facility])   COPD (chronic obstructive pulmonary disease) ([facility])   Electrolyte abnormalities likely related to ongoing diarrhea since surgery - check GI pathogen panel - check C. Diff PCR - enteric precautions pending above -Continue cholestyramine -Add Metamucil -Increase Imodium to 4 mg twice daily -d/c PPI and start H2 blocker   Hypokalemia -  Potassium chloride IV and PO  Hypomagnesemia -  Additional 4gm IV magnesium on top of 2gm recently ordered by ER MD -  ECG without prolonged QTc -  telemetry  Hypocalcemia -  Additional calcium gluconate 4gm IV once - check vitamin D level -  Continue vitamin D supplementation -  F/u phosphorus:  wnl  Recent partial small bowel resection, right hemicolectomy, and SMA embolectomy due to SMA thrombosis with ongoing diarrhea -  Malabsorption and poor appetite may be contributing to electrolyte abnl -  Likely full dose aspirin and apixaban lifelong per vascular surgery at [facility] -   If he will be discharged and can follow up early next week, they will remove staples in the office, otherwise, if it will be longer than that, we should remove them  COPD, stable -Continue Dulera  Obstructive sleep apnea -  cpap nightly  Remote history of pulmonary embolism -Apixaban  Diabetes mellitus type 2 -  Hold metformin -  Start low dose SSI  Essential hypertension, BP low - Hold losartan -  Placed hold parameter for metoprolol  Hyperlipidemia, stable -Continue fenofibrate, co-Q10, atorvastatin  Normocytic anemia -  Iron studies, B12, folate -  TSH -  Occult stool -  Repeat hgb in AM  Generalized weakness -  Patient does not want to return to SNF -  PT eval  Probable malnutrition -  Diabetic -  Nutrition consult -  glucerna   DVT prophylaxis: apixaban Code Status:  Full code Family Communication:  Patient and wife and daughter Disposition Plan: likely to SNF in a day or two  Consults called:  Dr. Eilleen Kempf from [facility] called me back Admission status: observation for electrolyte repletion, nutrition consult, and management of diarrhea.    Janece Canterbury MD [facility] Pager [phone]  If 7PM-7AM, please contact night-coverage [email or web address] Password [facility]  01/29/2018, 2:48 PM

## 2018-01-30 DIAGNOSIS — D649 Anemia, unspecified: Secondary | ICD-10-CM

## 2018-01-30 DIAGNOSIS — E1169 Type 2 diabetes mellitus with other specified complication: Secondary | ICD-10-CM | POA: Diagnosis not present

## 2018-01-30 DIAGNOSIS — K911 Postgastric surgery syndromes: Secondary | ICD-10-CM | POA: Diagnosis not present

## 2018-01-30 DIAGNOSIS — E440 Moderate protein-calorie malnutrition: Secondary | ICD-10-CM

## 2018-01-30 DIAGNOSIS — E669 Obesity, unspecified: Secondary | ICD-10-CM

## 2018-01-30 DIAGNOSIS — I10 Essential (primary) hypertension: Secondary | ICD-10-CM

## 2018-01-30 LAB — GLUCOSE, CAPILLARY
GLUCOSE-CAPILLARY: 89 mg/dL (ref 65–99)
GLUCOSE-CAPILLARY: 127 mg/dL — AB (ref 65–99)
Glucose-Capillary: 93 mg/dL (ref 65–99)
Glucose-Capillary: 142 mg/dL — ABNORMAL HIGH (ref 65–99)

## 2018-01-30 LAB — MAGNESIUM: Magnesium: 1.6 mg/dL — ABNORMAL LOW (ref 1.7–2.4)

## 2018-01-30 LAB — GASTROINTESTINAL PANEL BY PCR, STOOL (REPLACES STOOL CULTURE)
ADENOVIRUS F40/41: NOT DETECTED
Astrovirus: NOT DETECTED
CRYPTOSPORIDIUM: NOT DETECTED
Campylobacter species: NOT DETECTED
Cyclospora cayetanensis: NOT DETECTED
ENTEROAGGREGATIVE E COLI (EAEC): NOT DETECTED
ENTEROPATHOGENIC E COLI (EPEC): NOT DETECTED
Entamoeba histolytica: NOT DETECTED
Enterotoxigenic E coli (ETEC): NOT DETECTED
GIARDIA LAMBLIA: NOT DETECTED
Norovirus GI/GII: NOT DETECTED
Plesimonas shigelloides: NOT DETECTED
Rotavirus A: NOT DETECTED
Salmonella species: NOT DETECTED
Sapovirus (I, II, IV, and V): NOT DETECTED
Shiga like toxin producing E coli (STEC): NOT DETECTED
Shigella/Enteroinvasive E coli (EIEC): NOT DETECTED
VIBRIO CHOLERAE: NOT DETECTED
VIBRIO SPECIES: NOT DETECTED
YERSINIA ENTEROCOLITICA: NOT DETECTED

## 2018-01-30 LAB — RENAL FUNCTION PANEL
Albumin: 2.4 g/dL — ABNORMAL LOW (ref 3.5–5.0)
Anion gap: 7 (ref 5–15)
BUN: 8 mg/dL (ref 6–20)
CHLORIDE: 112 mmol/L — AB (ref 101–111)
CO2: 21 mmol/L — ABNORMAL LOW (ref 22–32)
Calcium: 7.4 mg/dL — ABNORMAL LOW (ref 8.9–10.3)
Creatinine, Ser: 0.91 mg/dL (ref 0.61–1.24)
GFR calc Af Amer: >60 mL/min (ref >60)
GFR calc non Af Amer: >60 mL/min (ref >60)
Glucose, Bld: 89 mg/dL (ref 65–99)
POTASSIUM: 3.5 mmol/L (ref 3.5–5.1)
Phosphorus: 3 mg/dL (ref 2.5–4.6)
Sodium: 140 mmol/L (ref 135–145)

## 2018-01-30 LAB — TSH: TSH: 0.658 u[IU]/mL (ref 0.350–4.500)

## 2018-01-30 LAB — IRON AND TIBC
Iron: 25 ug/dL — ABNORMAL LOW (ref 45–182)
Saturation Ratios: 8 % — ABNORMAL LOW (ref 17.9–39.5)
TIBC: 326 ug/dL (ref 250–450)
UIBC: 301 ug/dL

## 2018-01-30 LAB — CALCIUM, IONIZED: CALCIUM, IONIZED, SERUM: 3.6 mg/dL — AB (ref 4.5–5.6)

## 2018-01-30 LAB — CBC
HEMATOCRIT: 29 % — AB (ref 39.0–52.0)
Hemoglobin: 9.1 g/dL — ABNORMAL LOW (ref 13.0–17.0)
MCH: 27.5 pg (ref 26.0–34.0)
MCHC: 31.4 g/dL (ref 30.0–36.0)
MCV: 87.6 fL (ref 78.0–100.0)
Platelets: 260 10*3/uL (ref 150–400)
RBC: 3.31 MIL/uL — ABNORMAL LOW (ref 4.22–5.81)
RDW: 15.9 % — AB (ref 11.5–15.5)
WBC: 4.4 10*3/uL (ref 4.0–10.5)

## 2018-01-30 LAB — VITAMIN B12: VITAMIN B 12: 386 pg/mL (ref 180–914)

## 2018-01-30 LAB — VITAMIN D 25 HYDROXY (VIT D DEFICIENCY, FRACTURES): VIT D 25 HYDROXY: 14.8 ng/mL — AB (ref 30.0–100.0)

## 2018-01-30 LAB — FOLATE: Folate: 27.6 ng/mL (ref >5.9)

## 2018-01-30 LAB — FERRITIN: FERRITIN: 330 ng/mL (ref 24–336)

## 2018-01-30 MED ORDER — POTASSIUM CHLORIDE CRYS ER 20 MEQ PO TBCR
40.0000 meq | EXTENDED_RELEASE_TABLET | Freq: Once | ORAL | Status: AC
  Administered 2018-01-30: 40 meq via ORAL
  Filled 2018-01-30: qty 2

## 2018-01-30 MED ORDER — MAGNESIUM SULFATE 2 GM/50ML IV SOLN
2.0000 g | Freq: Once | INTRAVENOUS | Status: AC
  Administered 2018-01-30: 2 g via INTRAVENOUS
  Filled 2018-01-30: qty 50

## 2018-01-30 MED ORDER — DIPHENOXYLATE-ATROPINE 2.5-0.025 MG PO TABS
1.0000 | ORAL_TABLET | Freq: Four times a day (QID) | ORAL | Status: DC | PRN
  Administered 2018-01-30: 1 via ORAL
  Filled 2018-01-30: qty 1

## 2018-01-30 MED ORDER — CHOLESTYRAMINE LIGHT 4 G PO PACK
4.0000 g | PACK | Freq: Two times a day (BID) | ORAL | Status: DC
  Administered 2018-01-30 – 2018-01-31 (×2): 4 g via ORAL
  Filled 2018-01-30 (×2): qty 1

## 2018-01-30 MED ORDER — LOPERAMIDE HCL 2 MG PO CAPS
4.0000 mg | ORAL_CAPSULE | Freq: Three times a day (TID) | ORAL | Status: DC
  Administered 2018-01-30 – 2018-01-31 (×4): 4 mg via ORAL
  Filled 2018-01-30 (×4): qty 2

## 2018-01-30 MED ORDER — GABAPENTIN 100 MG PO CAPS
100.0000 mg | ORAL_CAPSULE | ORAL | Status: DC
  Administered 2018-01-30 – 2018-01-31 (×3): 100 mg via ORAL
  Filled 2018-01-30 (×3): qty 1

## 2018-01-30 MED ORDER — GABAPENTIN 100 MG PO CAPS
200.0000 mg | ORAL_CAPSULE | Freq: Every day | ORAL | Status: DC
  Administered 2018-01-30 – 2018-01-31 (×2): 200 mg via ORAL
  Filled 2018-01-30 (×2): qty 2

## 2018-01-30 NOTE — Progress Notes (Signed)
[facility] PROGRESS NOTE  Gerald Bullock TDV:[id number] DOB: February 07, 1946 DOA: 01/28/2018  PCP: [facility]  Brief History/Interval Summary: 72 year old Caucasian male with a past medical history of COPD on home oxygen, history of sleep apnea on CPAP, diabetes mellitus type 2, hypertension, remote history of pulmonary embolism, recent history of acute SMA thrombosis with necrotic bowel in March for which he was hospitalized at [facility] and underwent SMA embolectomy and followed by small bowel resection.  He has an estimated 200 cm of small bowel remaining.  After his surgery he has been having copious watery diarrhea.  Over the last 2 days prior to hospitalization he has had some nausea as fatigue and so he was brought into the hospital.  He has been feeling dizzy and lightheaded.  He was found to have electrolyte imbalances.  He was hospitalized for further management.  He was in a skilled nursing facility recuperating from his recent hospitalization and surgery.  Reason for Visit: Electrolyte abnormalities  Consultants: None  Procedures: None  Antibiotics: None  Subjective/Interval History: Patient states that he is feeling better this morning.  Feels stronger.  Continues to have about 10 bowel movements every day.  He describes them as watery with only specks of stools.  Denies any blood in the stool.  There is a large quantity.  Denies any nausea vomiting.  His daughter is at the bedside.  ROS: No chest pain or shortness of breath.  Objective:  Vital Signs  Vitals:   01/29/18 2014 01/30/18 0500 01/30/18 1100 01/30/18 1206  BP: 114/63 102/65 119/65   Pulse: 74 64 64   Resp: 20 20    Temp: 98.7 F (37.1 C) 98.4 F (36.9 C)    TempSrc: Oral Oral    SpO2: 95% 93%  100%  Weight:      Height:        Intake/Output Summary (Last 24 hours) at 01/30/2018 1241 Last data filed at 01/30/2018 1100 Gross per 24 hour  Intake 1010 ml  Output 900 ml    Net 110 ml   Filed Weights   01/29/18 1440  Weight: 121.4 kg (267 lb 11.2 oz)    General appearance: alert, cooperative, appears stated age and no distress Head: Normocephalic, without obvious abnormality, atraumatic Resp: clear to auscultation bilaterally Cardio: regular rate and rhythm, S1, S2 normal, no murmur, click, rub or gallop GI: Abdomen is soft.  Incision site appears to be clean.  No active drainage is noted.  Staples are present.  Abdomen is mildly tender without any rebound rigidity or guarding.  No masses organomegaly. Extremities: edema Mild edema noted bilateral lower extremity Neurologic: No focal deficit  Lab Results:  Data Reviewed: I have personally reviewed following labs and imaging studies  CBC: Recent Labs  Lab 01/29/18 0025 01/29/18 1121 01/30/18 0428  WBC 5.8  --  4.4  NEUTROABS 4.2  --   --   HGB 9.9* 9.2* 9.1*  HCT 30.7* 27.0* 29.0*  MCV 84.6  --  87.6  PLT 249  --  062    Basic Metabolic Panel: Recent Labs  Lab 01/29/18 0024 01/29/18 0025 01/29/18 0928 01/29/18 1121 01/30/18 0428  NA  --  139  --  143 140  K  --  3.5  --  2.8* 3.5  CL  --  111  --  108 112*  CO2  --  19*  --   --  21*  GLUCOSE  --  89  --  123* 89  BUN  --  8  --  5* 8  CREATININE  --  0.94  --  0.80 0.91  CALCIUM  --  6.2* 6.5*  --  7.4*  MG  --  0.3* 0.9*  --  1.6*  PHOS 3.4  --   --   --  3.0    GFR: Estimated Creatinine Clearance: 104.6 mL/min (by C-G formula based on SCr of 0.91 mg/dL).  Liver Function Tests: Recent Labs  Lab 01/29/18 0025 01/30/18 0428  AST 54*  --   ALT 30  --   ALKPHOS 74  --   BILITOT 1.3*  --   PROT 5.3*  --   ALBUMIN 2.3* 2.4*    CBG: Recent Labs  Lab 01/29/18 1711 01/29/18 2123 01/30/18 0721 01/30/18 1156  GLUCAP 112* 95 89 127*    Thyroid Function Tests: Recent Labs    01/30/18 0428  TSH 0.658    Anemia Panel: Recent Labs    01/30/18 0428  VITAMINB12 386  FOLATE 27.6  FERRITIN 330  TIBC 326  IRON  25*    Recent Results (from the past 240 hour(s))  MRSA PCR Screening     Status: None   Collection Time: 01/29/18  5:22 PM  Result Value Ref Range Status   MRSA by PCR NEGATIVE NEGATIVE Final    Comment:        The GeneXpert MRSA Assay (FDA approved for NASAL specimens only), is one component of a comprehensive MRSA colonization surveillance program. It is not intended to diagnose MRSA infection nor to guide or monitor treatment for MRSA infections. Performed at [facility], [place] [address], [place], [place] [address]   C difficile quick scan w PCR reflex     Status: None   Collection Time: 01/29/18  6:13 PM  Result Value Ref Range Status   C Diff antigen NEGATIVE NEGATIVE Final   C Diff toxin NEGATIVE NEGATIVE Final   C Diff interpretation No C. difficile detected.  Final    Comment: Performed at [facility], [place] [address], [place], [place] [address]      Radiology Studies: Ct Angio Abd/pel W And/or Wo Contrast  Result Date: 01/29/2018 CLINICAL DATA:  72 year old male with lethargy and abdominal pain. History of SMA thrombosis several weeks ago now status post SMA thrombectomy and bowel resection performed at [facility]. EXAM: CT ANGIOGRAPHY ABDOMEN AND PELVIS WITH CONTRAST AND WITHOUT CONTRAST TECHNIQUE: Multidetector CT imaging of the abdomen and pelvis was performed using the standard protocol during bolus administration of intravenous contrast. Multiplanar reconstructed images and MIPs were obtained and reviewed to evaluate the vascular anatomy. CONTRAST:  158mL ISOVUE-370 IOPAMIDOL (ISOVUE-370) INJECTION 76% COMPARISON:  Prior CT scan of the abdomen and pelvis 01/06/2018 FINDINGS: VASCULAR Aorta: Mild atherosclerotic vascular calcifications. No evidence of dissection or aneurysm. Celiac: Conventional celiac artery anatomy. 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 The splenic artery is severely narrowed but remains patent. The common hepatic artery appears widely patent. SMA: Interval surgical changes adjacent to the splenic artery with new surgical clips posterior to the pancreatic parenchyma. The thrombus within the main SMA has resolved. There is persistent occlusion of 1 of the jejunal branches which then reconstitutes via collateral flow. Renals: Solitary dominant renal arteries. No significant stenosis, dissection or aneurysm. No changes to suggest fibromuscular dysplasia. IMA: Patent without evidence  of aneurysm, dissection, vasculitis or significant stenosis. Inflow: Patent without evidence of aneurysm, dissection, vasculitis or significant stenosis. Proximal Outflow: Bilateral common femoral and visualized portions of the superficial and profunda femoral arteries are patent without evidence of aneurysm, dissection, vasculitis or significant stenosis. Veins: Widely patent hepatic, portal, renal and visceral veins. No evidence of iliac or caval thrombosis. Review of the MIP images confirms the above findings. NON-VASCULAR Lower chest: Small calcified granuloma in the left lower lobe. Relatively low inspiratory volumes with subsegmental atelectasis bilaterally. Trace pleural effusions. Relative elevation of the left hemidiaphragm which was seen on the prior study. Surgical changes of prior aortic valve replacement. Mild cardiomegaly. No pericardial effusion. Unremarkable visualized distal thoracic esophagus. Hepatobiliary: No focal liver abnormality is seen. Status post cholecystectomy. No biliary dilatation. Pancreas: Unremarkable. No pancreatic ductal dilatation or surrounding inflammatory changes. Spleen: Stable wedge-shaped regions of hypoattenuation compared to the prior imaging consistent with areas of prior splenic infarction. No new lesion or abnormality. Adrenals/Urinary Tract: The adrenal glands are normal. No  significant interval change in the appearance of a bilateral circumscribed benign renal cysts. No hydronephrosis or nephrolithiasis. No evidence of renal infarct. Stomach/Bowel: Surgical changes of recent midline incision, small bowel resection and right hemicolectomy with enterocolonic anastomosis. The enterocolonic anastomosis appears patent. There is some residual inspissated ingested material within the colon just beyond the anastomosis. Periampullary and D3 duodenal diverticula. Mild colonic diverticulosis without evidence of active inflammation. No evidence of bowel obstruction, ileus or focal bowel wall thickening. No pneumatosis. Lymphatic: No suspicious lymphadenopathy. Reproductive: Prostate is unremarkable. Other: Healing midline surgical incision. Unchanged omental fat containing umbilical hernia compared to the preoperative study. Mild inflammatory stranding in the retroperitoneal fat at the mesenteric root in the region of the SMA is likely postsurgical. Mild inflammatory stranding in the mesentery and omentum in the region of the prior bowel resection. No significant ascites or evidence of focal fluid collection to suggest abscess. Musculoskeletal: No acute fracture or aggressive appearing lytic or blastic osseous lesion. IMPRESSION: VASCULAR 1. Significant interval improvement in the previously noted SMA thrombosis compared to 01/06/2018. The main SMA is now widely patent with evidence of recent open surgical thrombectomy and placement of what appears to be a small jump bypass graft . One of the proximal jejunal branches remains occluded but reconstitutes distally via collateral flow. 2. 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 The splenic and common hepatic arteries remain patent and well opacified. 3.  Aortic Atherosclerosis (ICD10-170.0). 4. Surgical changes of prior aortic valve replacement. NON-VASCULAR 1. Expected  surgical changes of distal small bowel resection and right hemicolectomy. There are expected postoperative changes in the associated mesenteries and adjacent omentum without evidence of complication. Specifically, the enterocolonic anastomosis appears widely patent, there is no evidence of obstruction, ileus, focal bowel wall thickening or pneumatosis, and there is no significant free fluid or evidence of intra-abdominal abscess. 2. Mild colonic and duodenal diverticulosis without evidence of active diverticulitis. 3. Healing midline surgical incision. 4. Unchanged omental fat containing umbilical hernia compared to the preoperative study. 5. Stable splenic infarct.  No evidence of new insult. 6. Low inspiratory volumes with scattered subsegmental atelectasis and trace bilateral pleural effusions. 7. Stable cardiomegaly. Signed, Criselda Peaches, MD [facility] Electronically Signed   By: Jacqulynn Cadet M.D.   On: 01/29/2018 08:30     Medications:  Scheduled: . apixaban  5 mg Oral BID  . aspirin EC  325 mg Oral Daily  . atorvastatin  20 mg Oral QPM  . B-complex with vitamin C  1 tablet Oral Daily  . cholecalciferol  1,000 Units Oral Daily  . cholestyramine  4 g Oral BID  . famotidine  20 mg Oral BID  . feeding supplement (GLUCERNA SHAKE)  237 mL Oral TID BM  . fenofibrate  160 mg Oral Daily  . furosemide  20 mg Oral Daily  . gabapentin  200 mg Oral Q breakfast   And  . gabapentin  100 mg Oral 2 times per day  . insulin aspart  0-9 Units Subcutaneous TID WC  . loperamide  4 mg Oral TID AC  . loratadine  10 mg Oral Daily  . metoprolol tartrate  12.5 mg Oral BID  . mometasone-formoterol  2 puff Inhalation BID  . multivitamin with minerals  1 tablet Oral Daily  . potassium chloride  40 mEq Oral Once  . psyllium  1 packet Oral BID   Continuous:  ZJI:[id number], clobetasol cream, diphenoxylate-atropine, ondansetron,  oxyCODONE-acetaminophen  Assessment/Plan:  Principal Problem:   Hypomagnesemia Active Problems:   Hypokalemia   Hypocalcemia   Generalized weakness   Superior mesenteric artery thrombosis (HCC)   OSA (obstructive sleep apnea)   Essential hypertension   Diabetes mellitus type 2 in obese (HCC)   COPD (chronic obstructive pulmonary disease) (HCC)   Malnutrition of moderate degree    Electrolyte abnormalities with hypokalemia and hypomagnesemia and hypocalcemia Most likely secondary to ongoing diarrhea.  Potassium and magnesium levels have improved.  Continue to replete.  Corrected calcium is 8.6.  Recheck tomorrow. Continue vitamin D supplementation.  Phosphorus level was normal.  Diarrhea most likely secondary to dumping syndrome Patient underwent bowel resection recently.  No significant change in his diarrhea from when he was discharged from [facility].  C. difficile is negative.  GI pathogen panel is pending.  Unlikely to be infectious.  Continue with cholestyramine.  Metamucil was added.  Increase Imodium.  Lomotil as needed.  PPI discontinued.  Started on H2 blocker.  Discontinue magnesium oxide as well as it can worsen the diarrhea.  TSH normal.  Recent partial small bowel resection and right hemicolectomy and SMA embolectomy This was done for SMA thrombosis.  This was done at [facility].  Patient likely experiencing dumping syndrome.  Patient is on aspirin and apixaban which will be continued.  He will need staple removal at surgeon office next week.  History of COPD Stable.  Continue Dulera  History of obstructive sleep apnea Stable.  Continue CPAP  Remote history of pulmonary embolism Continue apixaban.  Diabetes mellitus type 2 Holding metformin.  Continue SSI.  Essential hypertension Holding losartan due to borderline low blood pressures.  Holding parameters for metoprolol.  Blood Pressure is stable this morning.  History of  hyperlipidemia Continue home medications.  Normocytic anemia Anemia panel reviewed.  Ferritin 330.  B12 386.  Folate 27.6.  Hemoglobin is stable.  Malnutrition of moderate degree Dietitian is following.  Generalized weakness and fatigue Most likely secondary to electrolyte imbalances.  Patient is at the skilled nursing facility.  He does not want to go back there.  PT and OT evaluation.  DVT Prophylaxis: On apixaban    Code Status: Full code Family Communication: Discussed with the patient and his daughter Disposition Plan: Management as outlined above.    LOS: 0 days   [facility] Pager [phone] 01/30/2018, 12:41 PM  If 7PM-7AM, please contact  night-coverage at [email or web address], password [facility]

## 2018-01-30 NOTE — Progress Notes (Signed)
Spoke with pt concerning discharge plans. Pt referred this CM to his wife. A call to pt's wife Gerald Bullock [phone] was made. Gerald Bullock agreed with HHRN/PT, [facility] was able to take pt related to being in Network with the [place].

## 2018-01-30 NOTE — Evaluation (Signed)
Occupational Therapy Evaluation Patient Details Name: Gerald Bullock MRN: [id number] DOB: 1945/12/07 Today's Date: 01/30/2018    History of Present Illness 72 y.o. male with history COPD and OSA on CPAP at night, diabetes mellitus type 2, hypertension, remote history of pulmonary embolism,and recent acute SMA thrombosis with necrotic bowel in March for which he underwent SMA embolectomy via longitudinal arteriotomy, partial small bowel resection, and right-handed colectomy on 3/12 at [facility] and admitted 01/28/18 with electrolyte abnormalities   Clinical Impression   Pt was admitted for the above. All education was completed.  No further OT is needed at this time    Follow Up Recommendations  No OT follow up    Equipment Recommendations  None recommended by OT    Recommendations for Other Services       Precautions / Restrictions Precautions Precautions: None Restrictions Weight Bearing Restrictions: No      Mobility Bed Mobility Overal bed mobility: Modified Independent                Transfers Overall transfer level: Modified independent                    Balance                                           ADL either performed or assessed with clinical judgement   ADL Overall ADL's : Needs assistance/impaired                                       General ADL Comments: supervision to gather items in room for grooming. He can then perform ADL without assistance. Of note, he dropped toothpaste then brush.  He has h/o CTS/CTR and doesn't have full feeling. He doesnt feel he needs any adaptation to decrease grip strength.  Encouraged rest breaks as dyspnea 2/4 with activity.       Vision         Perception     Praxis      Pertinent Vitals/Pain Pain Assessment: No/denies pain     Hand Dominance Right   Extremity/Trunk Assessment Upper Extremity Assessment Upper Extremity Assessment: Overall WFL  for tasks assessed(pt often drops things:  h/o CTS/release on R)      Cervical / Trunk Assessment Cervical / Trunk Assessment: Normal   Communication Communication Communication: No difficulties   Cognition Arousal/Alertness: Awake/alert Behavior During Therapy: WFL for tasks assessed/performed Overall Cognitive Status: Within Functional Limits for tasks assessed                                     General Comments  demonstrates good safety with RW    Exercises     Shoulder Instructions      Home Living Family/patient expects to be discharged to:: Private residence Living Arrangements: Spouse/significant other Available Help at Discharge: Family Type of Home: House Home Access: Level entry     Home Layout: One level     Bathroom Shower/Tub: Teacher, early years/pre: Handicapped height     Home Equipment: Toilet riser;Shower seat   Additional Comments: pt can sit on tub seat and swing his legs into tub; very sturdy per pt  Prior Functioning/Environment Level of Independence: Independent                 OT Problem List:        OT Treatment/Interventions:      OT Goals(Current goals can be found in the care plan section) Acute Rehab OT Goals Patient Stated Goal: go home upon d/c, not back to SNF OT Goal Formulation: All assessment and education complete, DC therapy  OT Frequency:     Barriers to D/C:            Co-evaluation              AM-PAC PT "6 Clicks" Daily Activity     Outcome Measure Help from another person eating meals?: None Help from another person taking care of personal grooming?: A Little Help from another person toileting, which includes using toliet, bedpan, or urinal?: A Little Help from another person bathing (including washing, rinsing, drying)?: A Little Help from another person to put on and taking off regular upper body clothing?: A Little Help from another person to put on and taking  off regular lower body clothing?: A Little 6 Click Score: 19   End of Session    Activity Tolerance: Patient tolerated treatment well Patient left: in bed;with call bell/phone within reach  OT Visit Diagnosis: Muscle weakness (generalized) (M62.81)                Time: 1456-1510 OT Time Calculation (min): 14 min Charges:  OT General Charges $OT Visit: 1 Visit OT Evaluation $OT Eval Low Complexity: 1 Low G-Codes:     [place], OTR/L [phone] 01/30/2018  Donjuan Robison 01/30/2018, 3:29 PM

## 2018-01-30 NOTE — Progress Notes (Signed)
Patient has ambulated x2, once 180 ft and then 369ft. Pt used walker, gait was steady.Pt reports tolerating activity well. Will continue to enocurage

## 2018-01-30 NOTE — Progress Notes (Signed)
Pt has home machine and will place on self when ready for sleep.  3L of oxygen added to circuit.

## 2018-01-30 NOTE — Progress Notes (Signed)
Spoke with provider this am regarding imodium. Provider gave V.O to administer prior to meals. Still awaiting GI panel.

## 2018-01-30 NOTE — Clinical Social Work Note (Signed)
Clinical Social Work Assessment  Patient Details  Name: Gerald Bullock MRN: [id number] Date of Birth: Jul 09, 1946  Date of referral:  01/30/18               Reason for consult:  (admitted from facility)                Permission sought to share information with:  Family Supports Permission granted to share information::  Yes, Verbal Permission Granted  Name::     Wife Chartered certified accountant::     Relationship::     Contact Information:     Housing/Transportation Living arrangements for the past 2 months:  Single Family Home(condo) Source of Information:  Patient, Adult Children Patient Interpreter Needed:  None Criminal Activity/Legal Involvement Pertinent to Current Situation/Hospitalization:  No - Comment as needed Significant Relationships:  Adult Children, Spouse Lives with:  Self, Spouse Do you feel safe going back to the place where you live?  Yes Need for family participation in patient care:  No (Coment)  Care giving concerns:  Pt lives at home with his wife in a one-level condo. States he has home O2 (has COPD) and has BC and walker and shower rails. Reports he has been at [facility] for 2 weeks for ST rehab and "the goal is to go straight home from here."   Facilities manager / plan:  CSW consulted to assist with disposition as pt is admitted from facility- [facility]. Has been there x 2 weeks for st rehab per pt and daughter. Was placed there from [facility] in March 2018. Pt had just participated with PT prior to CSW assessment. Reports plan to DC home from this hospitalization. States he is interested in [place] [facility]. Pt's daughter at bedside participated in conversation as well.   Plan: Pt planning to return home at DC. Barriers: Medical stability, home health needs  Employment status:  Retired Engineer, maintenance (IT)) PT Recommendations:  Home with [place] / Referral to community resources:      Patient/Family's Response to care:  Pt appreciative and engaged  Patient/Family's Understanding of and Emotional Response to Diagnosis, Current Treatment, and Prognosis:  Pt demonstrated very good understanding of treatment and plan. Clearly familiar with process as pt provided relevant information to DC planning without much inquiry/prompting from CSW. Very clear about his plan - "I think I've done really well in rehab and the goal is to go home from here."  Emotional Assessment Appearance:  Appears stated age Attitude/Demeanor/Rapport:  Engaged Affect (typically observed):  Accepting, Adaptable, Calm Orientation:  Oriented to Situation, Oriented to  Time, Oriented to Place, Oriented to Self Alcohol / Substance use:  Not Applicable Psych involvement (Current and /or in the community):  No (Comment)  Discharge Needs  Concerns to be addressed:    Readmission within the last 30 days:  No(reports he was admitted to [facility] on 01/06/18) Current discharge risk:  None Barriers to Discharge:  Continued Medical Work up   [employer], LCSW 01/30/2018, 11:33 AM [phone]

## 2018-01-30 NOTE — Care Management Note (Signed)
Case Management Note  Patient Details  Name: Gerald Bullock MRN: [id number] Date of Birth: 24-Dec-1945  Subjective/Objective:  Pt admitted with Hypomagnesemia                  Action/Plan: Pt will [facility] with [facility] needs and follow up at the [facility]   Expected Discharge Date:  (unknown)               Expected Discharge Plan:  [place]  In-House Referral:     Discharge planning Services  CM Consult  Post Acute Care Choice:    Choice offered to:  Spouse  DME Arranged:    DME Agency:     HH Arranged:  RN, PT [place] Agency:  [facility]  Status of Service:  Completed, signed off  If discussed at [place] of Stay Meetings, dates discussed:    Additional CommentsPurcell Mouton, RN 01/30/2018, 1:33 PM

## 2018-01-30 NOTE — Progress Notes (Signed)
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 Presents this admission with electrolyte abnormalities likely related to ongoing diarrhea.   Spoke with pt at bedside. States his appetite has increased slightly since his surgery two weeks ago. Post surgery, he was placed in a SNF, where he is served three meals and one supplement each day. He typically consumes 1-2 of those meals, depending on what is served. He does not like the food options there. And mostly sticks to sandwishes. He drinks the one Ensure they provide each day and tolerates them well. Pt had 100% of his omelet, bacon, fruit, and blueberry muffin and continues to have 8-10 watery stools this admission. He is amendable to supplementation this stay.   Pt endorses a UBW of 315 lb, the last time being at that weight three weeks ago. Records indicate pt weighed 287 lb 01/16/18 and 267 lb this admission (6.9% in one month, significant for time frame). Nutrition-Focused physical exam completed.    Medications reviewed and include: Vit C with B complex, Vit D, 20 mg lasix, SSI, MVI with minerals, 40 mEq KCl Labs reviewed: Mg 1.6 (L) calcium ionized 0.89 (L)  NUTRITION - FOCUSED PHYSICAL EXAM:    Most Recent Value  Orbital Region  No depletion  Upper Arm Region  No depletion  Thoracic and Lumbar Region  Unable to assess  Buccal Region  No depletion  Temple Region  Mild depletion  Clavicle Bone Region  No depletion  Clavicle and Acromion Bone Region  Mild depletion  Scapular Bone Region  Unable to assess  Dorsal Hand  No depletion  Patellar Region  No depletion  Anterior Thigh Region  No depletion  Posterior Calf Region  Mild depletion  Edema (RD Assessment)  Mild     Diet Order:  Diet Carb Modified Fluid consistency: Thin; Room service appropriate? Yes  EDUCATION NEEDS:      Skin:  Skin Assessment: Skin Integrity Issues: Skin Integrity Issues:: Incisions Incisions: closed abdomen  Last BM:  01/30/18  Height:   Ht Readings from Last 1 Encounters:  01/29/18 6\' 2"  (1.88 m)    Weight:   Wt Readings from Last 1 Encounters:  01/29/18 267 lb 11.2 oz (121.4 kg)    Ideal Body Weight:  86.4 kg  BMI:  Body mass index is 34.37 kg/m.  Estimated Nutritional Needs:   Kcal:  2300-2500 kcal  Protein:  115-125 g  Fluid:  >2.3 L/day    Mariana Single RD, LDN Clinical Nutrition Pager # - [phone]

## 2018-01-30 NOTE — Evaluation (Signed)
Physical Therapy Evaluation Patient Details Name: Gerald Bullock MRN: [id number] DOB: Dec 29, 1945 Today's Date: 01/30/2018   History of Present Illness  72 y.o. 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 Pt currently with functional limitations due to the deficits listed below (see PT Problem List).  Pt will benefit from skilled PT to increase their independence and safety with mobility to allow discharge to the venue listed below.  Pt ambulated good distance around unit and hopeful to d/c home soon.  Pt admitted from rehab however mobilizing well now and prefers to d/c home.  Pt has RW and can f/u with HHPT.  Will check back if pt remains in acute however pt encouraged to ambulate with family/staff during acute stay.     Follow Up Recommendations Home health PT    Equipment Recommendations  None recommended by PT    Recommendations for Other Services       Precautions / Restrictions Precautions Precautions: None Restrictions Weight Bearing Restrictions: No      Mobility  Bed Mobility Overal bed mobility: Modified Independent                Transfers Overall transfer level: Modified independent                  Ambulation/Gait Ambulation/Gait assistance: Supervision Ambulation Distance (Feet): 400 Feet Assistive device: Rolling walker (2 wheeled) Gait Pattern/deviations: Step-through pattern;Decreased stride length     General Gait Details: slow but steady pace with RW, tolerated distance well  Stairs            Wheelchair Mobility    Modified  Rankin (Stroke Patients Only)       Balance                                             Pertinent Vitals/Pain Pain Assessment: No/denies pain    Home Living Family/patient expects to be discharged to:: Private residence Living Arrangements: Spouse/significant other   Type of Home: House Home Access: Level entry     Home Layout: One level Home Equipment: Environmental consultant - 2 wheels      Prior Function Level of Independence: Independent               Hand Dominance        Extremity/Trunk Assessment        Lower Extremity Assessment Lower Extremity Assessment: Overall WFL for tasks assessed    Cervical / Trunk Assessment Cervical / Trunk Assessment: Normal  Communication   Communication: No difficulties  Cognition Arousal/Alertness: Awake/alert Behavior During Therapy: WFL for tasks assessed/performed Overall Cognitive Status: Within Functional Limits for tasks assessed                                        General Comments      Exercises     Assessment/Plan    PT Assessment Patient needs continued PT services  PT Problem List Decreased mobility;Decreased  strength;Decreased activity tolerance;Decreased knowledge of use of DME       PT Treatment Interventions DME instruction;Gait training;Therapeutic exercise;Therapeutic activities;Patient/family education;Stair training;Functional mobility training;Balance training    PT Goals (Current goals can be found in the Care Plan section)  Acute Rehab PT Goals Patient Stated Goal: go home upon d/c, not back to SNF PT Goal Formulation: With patient/family Time For Goal Achievement: 02/06/18 Potential to Achieve Goals: Good    Frequency Min 3X/week   Barriers to discharge        Co-evaluation               AM-PAC PT "6 Clicks" Daily Activity  Outcome Measure Difficulty turning over in bed (including adjusting bedclothes, sheets and blankets)?: None Difficulty  moving from lying on back to sitting on the side of the bed? : None Difficulty sitting down on and standing up from a chair with arms (e.g., wheelchair, bedside commode, etc,.)?: None Help needed moving to and from a bed to chair (including a wheelchair)?: A Little Help needed walking in hospital room?: A Little Help needed climbing 3-5 steps with a railing? : A Little 6 Click Score: 21    End of Session   Activity Tolerance: Patient tolerated treatment well Patient left: in chair;with call bell/phone within reach;with family/visitor present Nurse Communication: Mobility status PT Visit Diagnosis: Difficulty in walking, not elsewhere classified (R26.2)    Time: 9179-1505 PT Time Calculation (min) (ACUTE ONLY): 19 min   Charges:   PT Evaluation $PT Eval Low Complexity: 1 Low     PT G CodesCarmelia Bake, PT, DPT 01/30/2018 Pager: [phone]  York Ram E 01/30/2018, 12:22 PM

## 2018-01-31 DIAGNOSIS — K911 Postgastric surgery syndromes: Secondary | ICD-10-CM

## 2018-01-31 LAB — RENAL FUNCTION PANEL
Albumin: 2.3 g/dL — ABNORMAL LOW (ref 3.5–5.0)
Anion gap: 6 (ref 5–15)
BUN: 12 mg/dL (ref 6–20)
CHLORIDE: 114 mmol/L — AB (ref 101–111)
CO2: 19 mmol/L — ABNORMAL LOW (ref 22–32)
Calcium: 7.7 mg/dL — ABNORMAL LOW (ref 8.9–10.3)
Creatinine, Ser: 0.81 mg/dL (ref 0.61–1.24)
GFR calc Af Amer: >60 mL/min (ref >60)
GFR calc non Af Amer: >60 mL/min (ref >60)
Glucose, Bld: 107 mg/dL — ABNORMAL HIGH (ref 65–99)
POTASSIUM: 4 mmol/L (ref 3.5–5.1)
Phosphorus: 2.4 mg/dL — ABNORMAL LOW (ref 2.5–4.6)
Sodium: 139 mmol/L (ref 135–145)

## 2018-01-31 LAB — CBC
HEMATOCRIT: 29.9 % — AB (ref 39.0–52.0)
Hemoglobin: 9.3 g/dL — ABNORMAL LOW (ref 13.0–17.0)
MCH: 27.5 pg (ref 26.0–34.0)
MCHC: 31.1 g/dL (ref 30.0–36.0)
MCV: 88.5 fL (ref 78.0–100.0)
Platelets: 258 10*3/uL (ref 150–400)
RBC: 3.38 MIL/uL — ABNORMAL LOW (ref 4.22–5.81)
RDW: 16 % — AB (ref 11.5–15.5)
WBC: 4 10*3/uL (ref 4.0–10.5)

## 2018-01-31 LAB — MAGNESIUM: Magnesium: 1.7 mg/dL (ref 1.7–2.4)

## 2018-01-31 LAB — GLUCOSE, CAPILLARY
GLUCOSE-CAPILLARY: 88 mg/dL (ref 65–99)
Glucose-Capillary: 92 mg/dL (ref 65–99)

## 2018-01-31 MED ORDER — PSYLLIUM 95 % PO PACK: 1.0000 | PACK | Freq: Two times a day (BID) | ORAL | 0 refills | Status: DC

## 2018-01-31 MED ORDER — APIXABAN 5 MG PO TABS: 5.0000 mg | ORAL_TABLET | Freq: Two times a day (BID) | ORAL | 1 refills | Status: AC

## 2018-01-31 MED ORDER — CALCIUM CARBONATE 600 MG PO TABS: 600.0000 mg | ORAL_TABLET | Freq: Two times a day (BID) | ORAL | 0 refills | Status: AC

## 2018-01-31 MED ORDER — DIPHENOXYLATE-ATROPINE 2.5-0.025 MG PO TABS: 1.0000 | ORAL_TABLET | Freq: Four times a day (QID) | ORAL | 0 refills | Status: DC | PRN

## 2018-01-31 MED ORDER — ATORVASTATIN CALCIUM 20 MG PO TABS: 20.0000 mg | ORAL_TABLET | Freq: Every day | ORAL | 0 refills | Status: AC

## 2018-01-31 MED ORDER — FAMOTIDINE 20 MG PO TABS: 20.0000 mg | ORAL_TABLET | Freq: Two times a day (BID) | ORAL | 0 refills | Status: AC

## 2018-01-31 MED ORDER — BUDESONIDE-FORMOTEROL FUMARATE 160-4.5 MCG/ACT IN AERO: 2.0000 | INHALATION_SPRAY | Freq: Two times a day (BID) | RESPIRATORY_TRACT | 0 refills | Status: AC

## 2018-01-31 MED ORDER — B COMPLEX VITAMINS PO CAPS: 1.0000 | ORAL_CAPSULE | Freq: Every day | ORAL | 0 refills | Status: AC

## 2018-01-31 MED ORDER — ONDANSETRON HCL 4 MG PO TABS: 4.0000 mg | ORAL_TABLET | Freq: Four times a day (QID) | ORAL | 0 refills | Status: AC | PRN

## 2018-01-31 MED ORDER — POTASSIUM CHLORIDE CRYS ER 20 MEQ PO TBCR: 20.0000 meq | EXTENDED_RELEASE_TABLET | Freq: Two times a day (BID) | ORAL | 0 refills | Status: AC

## 2018-01-31 MED ORDER — METOPROLOL TARTRATE 25 MG PO TABS: 12.5000 mg | ORAL_TABLET | Freq: Two times a day (BID) | ORAL | 0 refills | Status: AC

## 2018-01-31 MED ORDER — OXYCODONE-ACETAMINOPHEN 5-325 MG PO TABS: 1.0000 | ORAL_TABLET | Freq: Two times a day (BID) | ORAL | 0 refills | Status: AC | PRN

## 2018-01-31 MED ORDER — CHOLESTYRAMINE 4 G PO PACK: 4.0000 g | PACK | Freq: Two times a day (BID) | ORAL | 3 refills | Status: AC

## 2018-01-31 MED ORDER — FENOFIBRATE 160 MG PO TABS: 160.0000 mg | ORAL_TABLET | Freq: Every day | ORAL | 0 refills | Status: AC

## 2018-01-31 MED ORDER — GLUCERNA SHAKE PO LIQD: 237.0000 mL | Freq: Three times a day (TID) | ORAL | 0 refills | Status: AC

## 2018-01-31 MED ORDER — LOPERAMIDE HCL 2 MG PO CAPS: 4.0000 mg | ORAL_CAPSULE | Freq: Three times a day (TID) | ORAL | 0 refills | Status: DC

## 2018-01-31 MED ORDER — ASPIRIN EC 325 MG PO TBEC: 325.0000 mg | DELAYED_RELEASE_TABLET | Freq: Every day | ORAL | 0 refills | Status: AC

## 2018-01-31 MED ORDER — GABAPENTIN 100 MG PO CAPS
100.0000 mg | ORAL_CAPSULE | Freq: Three times a day (TID) | ORAL | 0 refills | Status: AC
Start: 2018-01-31 — End: ?

## 2018-01-31 MED ORDER — MAGNESIUM OXIDE 400 MG PO TABS: 400.0000 mg | ORAL_TABLET | Freq: Every day | ORAL | 0 refills | Status: DC

## 2018-01-31 NOTE — Discharge Instructions (Signed)
Dumping Syndrome °Dumping syndrome is a combination of symptoms caused when food passes through your stomach too quickly. Dumping syndrome is also called rapid gastric emptying. There are two types of dumping syndrome. Early dumping syndrome is the most common type and causes symptoms that occur soon after eating. Late dumping syndrome causes symptoms that occur a few hours after eating. Some people have both types. °When you eat and drink, your stomach needs time to begin the process of digestion. Sometimes undigested food and stomach juices move too quickly into the next part of your digestive system (small intestine). This causes symptoms of early dumping syndrome. In other cases, your pancreas releases too much insulin because of large amounts of sugar moving into your small intestine. Insulin is a hormone that takes sugar from your bloodstream. If your blood sugar gets too low (hypoglycemia), you may develop symptoms of late dumping syndrome. °What are the causes? °· Early dumping syndrome is caused by large amounts of undigested food and liquids passing into the upper part of your small intestine. °· Late dumping syndrome is caused by large amounts of sugar from your diet moving into your small intestine. °What increases the risk? °Certain types of stomach surgery can lead to dumping syndrome. You may be at risk for dumping syndrome if you have had: °· Surgery for ulcers or stomach cancer. °· Weight loss surgery (bariatric surgery). °· Surgery to treat severe heartburn (gastroesophageal reflux). ° °What are the signs or symptoms? °Symptoms may start soon after eating or a few hours later. Symptoms of early dumping syndrome may begin 10-30 minutes after a meal. They include: °· Nausea. °· Fullness or bloating. °· Cramps. °· Vomiting. °· Feeling flushed. °· Sweating. °· Feeling dizzy. °· A fast or irregular heartbeat (palpitations). °· Diarrhea. °· Headache. ° °Symptoms of late dumping syndrome usually happen  2-3 hours after a meal. They may include: °· Dizziness. °· Weakness. °· Sweating. °· Palpitations. °· Hunger. °· Confusion. ° °How is this diagnosed? °Your health care provider may diagnose dumping syndrome based on your symptoms and medical history. A physical exam will be done. You may need to have certain tests to confirm a diagnosis of dumping syndrome. These may include: °· Modified glucose tolerance test. This blood test measures insulin secretion after you have a certain type of sugary (glucose) drink. °· Gastric emptying test. This test involves eating a bland meal that includes a material that shows up easily on an X-ray. After eating the meal, you have X-rays to determine how long it takes the stomach to empty. °· Upper endoscopy. In this examination, a health care provider uses a flexible telescope (endoscope) passed through your mouth and throat to check the inside of your stomach. ° °How is this treated? °For most people, dumping syndrome can be managed with diet changes. You may need to change your diet, nutrition, and eating habits. Working with a dietitian can be helpful. Other treatments may include: °· Medicine that slows stomach emptying and reduces insulin secretion. This medicine may be given by injection. °· Medicine that slows down the digestion of sugar. This medicine is usually used to treat type 2 diabetes. Sometimes it can also relieve symptoms of late dumping syndrome. °· Surgery. For severe cases of dumping syndrome, reconstructive stomach surgery may be needed if other treatments are not working. ° °Follow these instructions at home: °· Take medicines only as directed by your health care provider. °· Work with a dietitian to change your diet and maintain good   nutrition. °· Eat smaller meals more often. For example, eat six small meals a day instead of three large meals. °· Eat foods that digest slowly. Make sure your diet includes plenty of: °? Fruits. °? Vegetables. °? Whole  grains. °· Get lots of protein by eating foods such as eggs, poultry, and fish. °· Eat slowly and chew your food completely. °· Do not drink while you are eating. Drink fluids before and after meals instead. °· Do not eat or drink anything with added sugar. This includes sweets and sugary drinks such as juice or soda. °· Do not eat or drink dairy products if they make your symptoms worse. °· Lie down for about 30 minutes after eating. °Contact a health care provider if: °· Your symptoms change or get worse. °· You have trouble maintaining a healthy weight. °This information is not intended to replace advice given to you by your health care provider. Make sure you discuss any questions you have with your health care provider. °Document Released: 09/06/2004 Document Revised: 03/21/2016 Document Reviewed: 01/10/2014 °[employer] Interactive Patient Education © 2018 [employer] ° °

## 2018-01-31 NOTE — Discharge Summary (Signed)
[facility]  Physician Discharge Summary   Patient ID: Gerald Bullock MRN: [id number] DOB/AGE: 1946-03-23 72 y.o.  Admit date: 01/28/2018 Discharge date: 01/31/2018  PCP: [facility] [place]:  Principal Problem:   Hypomagnesemia Active Problems:   Hypokalemia   Hypocalcemia   Generalized weakness   Superior mesenteric artery thrombosis (HCC)   OSA (obstructive sleep apnea)   Essential hypertension   Diabetes mellitus type 2 in obese (HCC)   COPD (chronic obstructive pulmonary disease) (HCC)   Malnutrition of moderate degree   RECOMMENDATIONS FOR OUTPATIENT FOLLOW UP: 1. Patient to follow-up with his surgeon at [facility] this week. 2. Home health ordered.  DISCHARGE CONDITION: fair  Diet recommendation: As instructed by dietitian  Filed Weights   01/29/18 1440  Weight: 121.4 kg (267 lb 11.2 oz)    INITIAL HISTORY: 72 year old Caucasian male with a past medical history of COPD on home oxygen, history of sleep apnea on CPAP, diabetes mellitus type 2, hypertension, remote history of pulmonary embolism, recent history of acute SMA thrombosis with necrotic bowel in March for which he was hospitalized at [facility] and underwent SMA embolectomy and followed by small bowel resection.  He has an estimated 200 cm of small bowel remaining.  After his surgery he has been having copious watery diarrhea.  Over the last 2 days prior to hospitalization he has had some nausea as fatigue and so he was brought into the hospital.  He has been feeling dizzy and lightheaded.  He was found to have electrolyte imbalances.  He was hospitalized for further management.  He was in a skilled nursing facility recuperating from his recent hospitalization and surgery.   HOSPITAL COURSE:   Electrolyte abnormalities with hypokalemia and hypomagnesemia and hypocalcemia Most likely secondary to ongoing diarrhea from dumping syndrome.  Patient's  electrolytes were aggressively repleted.  Calcium was also corrected.  Potassium and magnesium levels are normal today.  Patient will be discharged with prescription for potassium and calcium.  Continue vitamin D supplementation.  Diarrhea most likely secondary to dumping syndrome Patient underwent bowel resection recently.  No significant change in his diarrhea from when he was discharged from [facility].  C. difficile is negative.  GI pathogen panel is negative as well.    Patient was continued on Imodium but the dose was increased.  Lomotil was added as needed.  He was also placed on Metamucil and cholestyramine was continued.  His PPI was discontinued and he was placed on Pepcid.  Dose of magnesium oxide has been decreased.  TSH normal.  Follow-up with providers at [facility].    Recent partial small bowel resection and right hemicolectomy and SMA embolectomy This was done for SMA thrombosis.  This was done at [facility].  Patient likely experiencing dumping syndrome.  Patient is on aspirin and apixaban which will be continued.  He will need staple removal at surgeon office next week.  History of COPD Stable.  Continue Dulera  History of obstructive sleep apnea Stable.  Continue CPAP  Remote history of pulmonary embolism Continue apixaban.  Diabetes mellitus type 2 Holding metformin as it can worsen diarrhea.    Continue checking CBGs at home.  Essential hypertension Holding losartan due to borderline low blood pressures.    Continue metoprolol.  History of hyperlipidemia Continue home medications.  Normocytic anemia Anemia panel reviewed.  Ferritin 330.  B12 386.  Folate 27.6.  Hemoglobin is stable.  Malnutrition of moderate degree Seen  by dietitian and education provided.  Generalized weakness and fatigue Most likely secondary to electrolyte imbalances.    Patient was at a skilled nursing facility when he was hospitalized.  He was seen by PT and  OT here.  Patient expressed an interest to go home instead.  Home health has been ordered.    Overall stable.  Okay for discharge home.  Discussed with patient and his wife.   PERTINENT LABS:  The results of significant diagnostics from this hospitalization (including imaging, microbiology, ancillary and laboratory) are listed below for reference.    Microbiology: Recent Results (from the past 240 hour(s))  MRSA PCR Screening     Status: None   Collection Time: 01/29/18  5:22 PM  Result Value Ref Range Status   MRSA by PCR NEGATIVE NEGATIVE Final    Comment:        The GeneXpert MRSA Assay (FDA approved for NASAL specimens only), is one component of a comprehensive MRSA colonization surveillance program. It is not intended to diagnose MRSA infection nor to guide or monitor treatment for MRSA infections. Performed at [facility], [place] [address] [place], [place] [address]   C difficile quick scan w PCR reflex     Status: None   Collection Time: 01/29/18  6:13 PM  Result Value Ref Range Status   C Diff antigen NEGATIVE NEGATIVE Final   C Diff toxin NEGATIVE NEGATIVE Final   C Diff interpretation No C. difficile detected.  Final    Comment: Performed at [facility], [place] [address] [place], [place] [address]  Gastrointestinal Panel by PCR , Stool     Status: None   Collection Time: 01/29/18  6:13 PM  Result Value Ref Range Status   Campylobacter species NOT DETECTED NOT DETECTED Final   Plesimonas shigelloides NOT DETECTED NOT DETECTED Final   Salmonella species NOT DETECTED NOT DETECTED Final   Yersinia enterocolitica NOT DETECTED NOT DETECTED Final   Vibrio species NOT DETECTED NOT DETECTED Final   Vibrio cholerae NOT DETECTED NOT DETECTED Final   Enteroaggregative E coli (EAEC) NOT DETECTED NOT DETECTED Final   Enteropathogenic E coli (EPEC) NOT DETECTED NOT DETECTED Final   Enterotoxigenic E coli (ETEC) NOT DETECTED NOT DETECTED  Final   Shiga like toxin producing E coli (STEC) NOT DETECTED NOT DETECTED Final   Shigella/Enteroinvasive E coli (EIEC) NOT DETECTED NOT DETECTED Final   Cryptosporidium NOT DETECTED NOT DETECTED Final   Cyclospora cayetanensis NOT DETECTED NOT DETECTED Final   Entamoeba histolytica NOT DETECTED NOT DETECTED Final   Giardia lamblia NOT DETECTED NOT DETECTED Final   Adenovirus F40/41 NOT DETECTED NOT DETECTED Final   Astrovirus NOT DETECTED NOT DETECTED Final   Norovirus GI/GII NOT DETECTED NOT DETECTED Final   Rotavirus A NOT DETECTED NOT DETECTED Final   Sapovirus (I, II, IV, and V) NOT DETECTED NOT DETECTED Final    Comment: Performed at [facility], [address] [place], [place] [id number]     Labs: Basic Metabolic Panel: Recent Labs  Lab 01/29/18 0024 01/29/18 0025 01/29/18 0928 01/29/18 1121 01/30/18 0428 01/31/18 0358  NA  --  139  --  143 140 139  K  --  3.5  --  2.8* 3.5 4.0  CL  --  111  --  108 112* 114*  CO2  --  19*  --   --  21* 19*  GLUCOSE  --  89  --  123* 89 107*  BUN  --  8  --  5* 8 12  CREATININE  --  0.94  --  0.80 0.91 0.81  CALCIUM  --  6.2* 6.5*  --  7.4* 7.7*  MG  --  0.3* 0.9*  --  1.6* 1.7  PHOS 3.4  --   --   --  3.0 2.4*   Liver Function Tests: Recent Labs  Lab 01/29/18 0025 01/30/18 0428 01/31/18 0358  AST 54*  --   --   ALT 30  --   --   ALKPHOS 74  --   --   BILITOT 1.3*  --   --   PROT 5.3*  --   --   ALBUMIN 2.3* 2.4* 2.3*   CBC: Recent Labs  Lab 01/29/18 0025 01/29/18 1121 01/30/18 0428 01/31/18 0358  WBC 5.8  --  4.4 4.0  NEUTROABS 4.2  --   --   --   HGB 9.9* 9.2* 9.1* 9.3*  HCT 30.7* 27.0* 29.0* 29.9*  MCV 84.6  --  87.6 88.5  PLT 249  --  260 258    CBG: Recent Labs  Lab 01/30/18 1156 01/30/18 1712 01/30/18 2116 01/31/18 0741 01/31/18 1129  GLUCAP 127* 93 142* 88 92     IMAGING STUDIES Ct Angio Abd/pel W And/or Wo Contrast  Result Date: 01/29/2018 CLINICAL DATA:  72 year old male with  lethargy and abdominal pain. History of SMA thrombosis several weeks ago now status post SMA thrombectomy and bowel resection performed at [facility]. EXAM: CT ANGIOGRAPHY ABDOMEN AND PELVIS WITH CONTRAST AND WITHOUT CONTRAST TECHNIQUE: Multidetector CT imaging of the abdomen and pelvis was performed using the standard protocol during bolus administration of intravenous contrast. Multiplanar reconstructed images and MIPs were obtained and reviewed to evaluate the vascular anatomy. CONTRAST:  13mL ISOVUE-370 IOPAMIDOL (ISOVUE-370) INJECTION 76% COMPARISON:  Prior CT scan of the abdomen and pelvis 01/06/2018 FINDINGS: VASCULAR Aorta: Mild atherosclerotic vascular calcifications. No evidence of dissection or aneurysm. Celiac: Conventional celiac artery anatomy. 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 The splenic artery is severely narrowed but remains patent. The common hepatic artery appears widely patent. SMA: Interval surgical changes adjacent to the splenic artery with new surgical clips posterior to the pancreatic parenchyma. The thrombus within the main SMA has resolved. There is persistent occlusion of 1 of the jejunal branches which then reconstitutes via collateral flow. Renals: Solitary dominant renal arteries. No significant stenosis, dissection or aneurysm. No changes to suggest fibromuscular dysplasia. IMA: Patent without evidence of aneurysm, dissection, vasculitis or significant stenosis. Inflow: Patent without evidence of aneurysm, dissection, vasculitis or significant stenosis. Proximal Outflow: Bilateral common femoral and visualized portions of the superficial and profunda femoral arteries are patent without evidence of aneurysm, dissection, vasculitis or significant stenosis. Veins: Widely patent hepatic, portal, renal and visceral veins. No evidence of iliac or caval  thrombosis. Review of the MIP images confirms the above findings. NON-VASCULAR Lower chest: Small calcified granuloma in the left lower lobe. Relatively low inspiratory volumes with subsegmental atelectasis bilaterally. Trace pleural effusions. Relative elevation of the left hemidiaphragm which was seen on the prior study. Surgical changes of prior aortic valve replacement. Mild cardiomegaly. No pericardial effusion. Unremarkable visualized distal thoracic esophagus. Hepatobiliary: No focal liver abnormality is seen. Status post cholecystectomy. No biliary dilatation. Pancreas: Unremarkable. No pancreatic ductal dilatation or surrounding inflammatory changes. Spleen: Stable wedge-shaped regions of hypoattenuation compared to the prior imaging  consistent with areas of prior splenic infarction. No new lesion or abnormality. Adrenals/Urinary Tract: The adrenal glands are normal. No significant interval change in the appearance of a bilateral circumscribed benign renal cysts. No hydronephrosis or nephrolithiasis. No evidence of renal infarct. Stomach/Bowel: Surgical changes of recent midline incision, small bowel resection and right hemicolectomy with enterocolonic anastomosis. The enterocolonic anastomosis appears patent. There is some residual inspissated ingested material within the colon just beyond the anastomosis. Periampullary and D3 duodenal diverticula. Mild colonic diverticulosis without evidence of active inflammation. No evidence of bowel obstruction, ileus or focal bowel wall thickening. No pneumatosis. Lymphatic: No suspicious lymphadenopathy. Reproductive: Prostate is unremarkable. Other: Healing midline surgical incision. Unchanged omental fat containing umbilical hernia compared to the preoperative study. Mild inflammatory stranding in the retroperitoneal fat at the mesenteric root in the region of the SMA is likely postsurgical. Mild inflammatory stranding in the mesentery and omentum in the region of  the prior bowel resection. No significant ascites or evidence of focal fluid collection to suggest abscess. Musculoskeletal: No acute fracture or aggressive appearing lytic or blastic osseous lesion. IMPRESSION: VASCULAR 1. Significant interval improvement in the previously noted SMA thrombosis compared to 01/06/2018. The main SMA is now widely patent with evidence of recent open surgical thrombectomy and placement of what appears to be a small jump bypass graft . One of the proximal jejunal branches remains occluded but reconstitutes distally via collateral flow. 2. 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 The splenic and common hepatic arteries remain patent and well opacified. 3.  Aortic Atherosclerosis (ICD10-170.0). 4. Surgical changes of prior aortic valve replacement. NON-VASCULAR 1. Expected surgical changes of distal small bowel resection and right hemicolectomy. There are expected postoperative changes in the associated mesenteries and adjacent omentum without evidence of complication. Specifically, the enterocolonic anastomosis appears widely patent, there is no evidence of obstruction, ileus, focal bowel wall thickening or pneumatosis, and there is no significant free fluid or evidence of intra-abdominal abscess. 2. Mild colonic and duodenal diverticulosis without evidence of active diverticulitis. 3. Healing midline surgical incision. 4. Unchanged omental fat containing umbilical hernia compared to the preoperative study. 5. Stable splenic infarct.  No evidence of new insult. 6. Low inspiratory volumes with scattered subsegmental atelectasis and trace bilateral pleural effusions. 7. Stable cardiomegaly. Signed, Criselda Peaches, MD [facility] Electronically Signed   By: Jacqulynn Cadet M.D.   On: 01/29/2018 08:30    DISCHARGE EXAMINATION: Vitals:    01/30/18 2027 01/31/18 0410 01/31/18 0922 01/31/18 0952  BP: (!) 101/59 110/69  103/70  Pulse: 72 71  72  Resp: 20 18    Temp: 98.5 F (36.9 C) 97.9 F (36.6 C)    TempSrc: Oral Oral    SpO2: 95% 97% 93%   Weight:      Height:       General appearance: alert, cooperative, appears stated age and no distress Resp: clear to auscultation bilaterally Cardio: regular rate and rhythm, S1, S2 normal, no murmur, click, rub or gallop GI: Incision site is clean and dry.  Staples noted. Extremities: extremities normal, atraumatic, no cyanosis or edema  DISPOSITION: Home with wife  Discharge Instructions    Call MD for:  difficulty breathing, headache or visual disturbances   Complete by:  As directed    Call MD for:  extreme fatigue   Complete by:  As directed    Call MD for:  persistant dizziness  or light-headedness   Complete by:  As directed    Call MD for:  persistant nausea and vomiting   Complete by:  As directed    Call MD for:  severe uncontrolled pain   Complete by:  As directed    Call MD for:  temperature >100.4   Complete by:  As directed    Discharge instructions   Complete by:  As directed    Please be sure to schedule appointment with your surgeon within next week for follow up and suture removal.   You were cared for by a hospitalist during your hospital stay. If you have any questions about your discharge medications or the care you received while you were in the hospital after you are discharged, you can call the unit and asked to speak with the hospitalist on call if the hospitalist that took care of you is not available. Once you are discharged, your primary care physician will handle any further medical issues. Please note that NO REFILLS for any discharge medications will be authorized once you are discharged, as it is imperative that you return to your primary care physician (or establish a relationship with a primary care physician if you do not have one) for your  aftercare needs so that they can reassess your need for medications and monitor your lab values. If you do not have a primary care physician, you can call [phone] for a physician referral.   Increase activity slowly   Complete by:  As directed         Allergies as of 01/31/2018      Reactions   Hydrocodone    Hallucinations, but tolerated codeine before   Varenicline    Hallucinations and respiratory distress      Medication List    STOP taking these medications   Co Q10 200 MG Caps   docusate sodium 100 MG capsule Commonly known as:  COLACE   furosemide 20 MG tablet Commonly known as:  LASIX   losartan 100 MG tablet Commonly known as:  COZAAR   metFORMIN 500 MG tablet Commonly known as:  GLUCOPHAGE   multivitamins ther. w/minerals Tabs tablet   omeprazole 40 MG capsule Commonly known as:  PRILOSEC     TAKE these medications   acetaminophen 500 MG tablet Commonly known as:  TYLENOL Take 1,000 mg by mouth 2 (two) times daily as needed for mild pain.   apixaban 5 MG Tabs tablet Commonly known as:  ELIQUIS Take 1 tablet (5 mg total) by mouth 2 (two) times daily.   aspirin EC 325 MG tablet Take 1 tablet (325 mg total) by mouth daily.   atorvastatin 20 MG tablet Commonly known as:  LIPITOR Take 1 tablet (20 mg total) by mouth daily.   b complex vitamins capsule Take 1 capsule by mouth daily.   budesonide-formoterol 160-4.5 MCG/ACT inhaler Commonly known as:  SYMBICORT Inhale 2 puffs into the lungs 2 (two) times daily. What changed:  when to take this   calcium carbonate 600 MG Tabs tablet Commonly known as:  OS-CAL Take 1 tablet (600 mg total) by mouth 2 (two) times daily with a meal.   cholecalciferol 1000 units tablet Commonly known as:  VITAMIN D Take 1,000 Units by mouth daily.   cholestyramine 4 g packet Commonly known as:  QUESTRAN Take 1 packet (4 g total) by mouth 2 (two) times daily.   clobetasol cream 0.05 % Commonly known as:   TEMOVATE Apply 1 application topically  daily as needed (rash).   diphenoxylate-atropine 2.5-0.025 MG tablet Commonly known as:  LOMOTIL Take 1 tablet by mouth 4 (four) times daily as needed for diarrhea or loose stools.   famotidine 20 MG tablet Commonly known as:  PEPCID Take 1 tablet (20 mg total) by mouth 2 (two) times daily.   feeding supplement (GLUCERNA SHAKE) Liqd Take 237 mLs by mouth 3 (three) times daily between meals.   fenofibrate 160 MG tablet Take 1 tablet (160 mg total) by mouth daily.   gabapentin 100 MG capsule Commonly known as:  NEURONTIN Take 1-2 capsules (100-200 mg total) by mouth 3 (three) times daily. 200 MG once daily in AM, 100 MG at noon and 100 MG at bedtime   loperamide 2 MG capsule Commonly known as:  IMODIUM Take 2 capsules (4 mg total) by mouth 3 (three) times daily before meals. What changed:    how much to take  when to take this  reasons to take this   loratadine 10 MG tablet Commonly known as:  CLARITIN Take 10 mg by mouth daily.   magnesium oxide 400 MG tablet Commonly known as:  MAG-OX Take 1 tablet (400 mg total) by mouth daily. Change to every other day if diarrhea worsens What changed:    when to take this  additional instructions   metoprolol tartrate 25 MG tablet Commonly known as:  LOPRESSOR Take 0.5 tablets (12.5 mg total) by mouth 2 (two) times daily.   ondansetron 4 MG tablet Commonly known as:  ZOFRAN Take 1 tablet (4 mg total) by mouth every 6 (six) hours as needed for nausea or vomiting.   oxyCODONE-acetaminophen 5-325 MG tablet Commonly known as:  PERCOCET/ROXICET Take 1 tablet by mouth 2 (two) times daily as needed for severe pain.   potassium chloride SA 20 MEQ tablet Commonly known as:  K-DUR,KLOR-CON Take 1 tablet (20 mEq total) by mouth 2 (two) times daily. What changed:    medication strength  how much to take  when to take this   psyllium 95 % Pack Commonly known as:   HYDROCIL/METAMUCIL Take 1 packet by mouth 2 (two) times daily.        Follow-up Information    Clinic, [place]. Schedule an appointment as soon as possible for a visit in 1 week(s).   Contact information: [place] [address] [phone]        [facility], [facility] Follow up.   Specialty:  [place] Why:  local number [phone] agency will call to arrange initial visit- [place] and RN Contact information: [place] [address] [phone]           TOTAL DISCHARGE TIME: 47 minutes  [facility] Pager [phone]  01/31/2018, 2:33 PM

## 2018-02-01 NOTE — Progress Notes (Signed)
02/01/2018 Faxed dc summary to [facility] per pt's request. Jonnie Finner RN CCM Case Mgmt phone [phone]

## 2018-02-14 ENCOUNTER — Encounter (HOSPITAL_COMMUNITY): Payer: Self-pay | Admitting: *Deleted

## 2018-02-14 ENCOUNTER — Observation Stay (HOSPITAL_COMMUNITY)
Admission: EM | Admit: 2018-02-14 | Discharge: 2018-02-16 | Disposition: A | Discharge status: Home / Self Care | Payer: Medicare Other | Attending: Family Medicine | Admitting: Family Medicine

## 2018-02-14 ENCOUNTER — Other Ambulatory Visit: Payer: Self-pay

## 2018-02-14 ENCOUNTER — Emergency Department (HOSPITAL_COMMUNITY): Payer: Medicare Other

## 2018-02-14 DIAGNOSIS — G4733 Obstructive sleep apnea (adult) (pediatric): Secondary | ICD-10-CM | POA: Insufficient documentation

## 2018-02-14 DIAGNOSIS — Z7982 Long term (current) use of aspirin: Secondary | ICD-10-CM | POA: Diagnosis not present

## 2018-02-14 DIAGNOSIS — R197 Diarrhea, unspecified: Secondary | ICD-10-CM

## 2018-02-14 DIAGNOSIS — Z888 Allergy status to other drugs, medicaments and biological substances status: Secondary | ICD-10-CM | POA: Diagnosis not present

## 2018-02-14 DIAGNOSIS — Z6831 Body mass index (BMI) 31.0-31.9, adult: Secondary | ICD-10-CM | POA: Diagnosis not present

## 2018-02-14 DIAGNOSIS — E43 Unspecified severe protein-calorie malnutrition: Secondary | ICD-10-CM | POA: Insufficient documentation

## 2018-02-14 DIAGNOSIS — K219 Gastro-esophageal reflux disease without esophagitis: Secondary | ICD-10-CM | POA: Insufficient documentation

## 2018-02-14 DIAGNOSIS — N179 Acute kidney failure, unspecified: Secondary | ICD-10-CM

## 2018-02-14 DIAGNOSIS — K909 Intestinal malabsorption, unspecified: Secondary | ICD-10-CM | POA: Diagnosis not present

## 2018-02-14 DIAGNOSIS — J90 Pleural effusion, not elsewhere classified: Secondary | ICD-10-CM | POA: Diagnosis not present

## 2018-02-14 DIAGNOSIS — E860 Dehydration: Secondary | ICD-10-CM | POA: Insufficient documentation

## 2018-02-14 DIAGNOSIS — Z9981 Dependence on supplemental oxygen: Secondary | ICD-10-CM | POA: Diagnosis not present

## 2018-02-14 DIAGNOSIS — Z86718 Personal history of other venous thrombosis and embolism: Secondary | ICD-10-CM | POA: Insufficient documentation

## 2018-02-14 DIAGNOSIS — K429 Umbilical hernia without obstruction or gangrene: Secondary | ICD-10-CM | POA: Insufficient documentation

## 2018-02-14 DIAGNOSIS — E872 Acidosis: Secondary | ICD-10-CM | POA: Diagnosis not present

## 2018-02-14 DIAGNOSIS — E119 Type 2 diabetes mellitus without complications: Secondary | ICD-10-CM | POA: Diagnosis not present

## 2018-02-14 DIAGNOSIS — Z885 Allergy status to narcotic agent status: Secondary | ICD-10-CM | POA: Diagnosis not present

## 2018-02-14 DIAGNOSIS — D735 Infarction of spleen: Secondary | ICD-10-CM | POA: Insufficient documentation

## 2018-02-14 DIAGNOSIS — E878 Other disorders of electrolyte and fluid balance, not elsewhere classified: Secondary | ICD-10-CM | POA: Diagnosis not present

## 2018-02-14 DIAGNOSIS — Z79899 Other long term (current) drug therapy: Secondary | ICD-10-CM | POA: Insufficient documentation

## 2018-02-14 DIAGNOSIS — Z7901 Long term (current) use of anticoagulants: Secondary | ICD-10-CM | POA: Insufficient documentation

## 2018-02-14 DIAGNOSIS — E669 Obesity, unspecified: Secondary | ICD-10-CM | POA: Diagnosis not present

## 2018-02-14 DIAGNOSIS — J449 Chronic obstructive pulmonary disease, unspecified: Secondary | ICD-10-CM | POA: Diagnosis not present

## 2018-02-14 DIAGNOSIS — Z86711 Personal history of pulmonary embolism: Secondary | ICD-10-CM | POA: Insufficient documentation

## 2018-02-14 DIAGNOSIS — Z9989 Dependence on other enabling machines and devices: Secondary | ICD-10-CM | POA: Insufficient documentation

## 2018-02-14 DIAGNOSIS — I10 Essential (primary) hypertension: Secondary | ICD-10-CM | POA: Diagnosis not present

## 2018-02-14 DIAGNOSIS — Z953 Presence of xenogenic heart valve: Secondary | ICD-10-CM | POA: Insufficient documentation

## 2018-02-14 DIAGNOSIS — K911 Postgastric surgery syndromes: Secondary | ICD-10-CM | POA: Diagnosis not present

## 2018-02-14 DIAGNOSIS — Z85820 Personal history of malignant melanoma of skin: Secondary | ICD-10-CM | POA: Insufficient documentation

## 2018-02-14 DIAGNOSIS — Z9049 Acquired absence of other specified parts of digestive tract: Secondary | ICD-10-CM | POA: Diagnosis not present

## 2018-02-14 LAB — COMPREHENSIVE METABOLIC PANEL
ALBUMIN: 3.5 g/dL (ref 3.5–5.0)
ALK PHOS: 161 U/L — AB (ref 38–126)
ALT: 103 U/L — ABNORMAL HIGH (ref 17–63)
ANION GAP: 10 (ref 5–15)
AST: 78 U/L — AB (ref 15–41)
BILIRUBIN TOTAL: 0.6 mg/dL (ref 0.3–1.2)
BUN: 22 mg/dL — ABNORMAL HIGH (ref 6–20)
CHLORIDE: 111 mmol/L (ref 101–111)
CO2: 15 mmol/L — AB (ref 22–32)
Calcium: 9.3 mg/dL (ref 8.9–10.3)
Creatinine, Ser: 1.17 mg/dL (ref 0.61–1.24)
GFR calc non Af Amer: >60 mL/min (ref >60)
Glucose, Bld: 130 mg/dL — ABNORMAL HIGH (ref 65–99)
POTASSIUM: 4.4 mmol/L (ref 3.5–5.1)
Sodium: 136 mmol/L (ref 135–145)
Total Protein: 7.5 g/dL (ref 6.5–8.1)

## 2018-02-14 LAB — C DIFFICILE QUICK SCREEN W PCR REFLEX
C DIFFICILE (CDIFF) TOXIN: NEGATIVE
C DIFFICLE (CDIFF) ANTIGEN: NEGATIVE
C Diff interpretation: NOT DETECTED

## 2018-02-14 LAB — URINALYSIS, ROUTINE W REFLEX MICROSCOPIC
BILIRUBIN URINE: NEGATIVE
GLUCOSE, UA: NEGATIVE mg/dL
Hgb urine dipstick: NEGATIVE
Ketones, ur: NEGATIVE mg/dL
LEUKOCYTES UA: NEGATIVE
Nitrite: NEGATIVE
PROTEIN: 30 mg/dL — AB
SPECIFIC GRAVITY, URINE: 1.026 (ref 1.005–1.030)
pH: 5 (ref 5.0–8.0)

## 2018-02-14 LAB — CBC
HCT: 41.6 % (ref 39.0–52.0)
HEMOGLOBIN: 13.7 g/dL (ref 13.0–17.0)
MCH: 27.8 pg (ref 26.0–34.0)
MCHC: 32.9 g/dL (ref 30.0–36.0)
MCV: 84.6 fL (ref 78.0–100.0)
PLATELETS: 348 10*3/uL (ref 150–400)
RBC: 4.92 MIL/uL (ref 4.22–5.81)
RDW: 15.7 % — ABNORMAL HIGH (ref 11.5–15.5)
WBC: 8.7 10*3/uL (ref 4.0–10.5)

## 2018-02-14 LAB — GLUCOSE, CAPILLARY: Glucose-Capillary: 102 mg/dL — ABNORMAL HIGH (ref 65–99)

## 2018-02-14 LAB — BLOOD GAS, VENOUS
Acid-base deficit: 9.8 mmol/L — ABNORMAL HIGH (ref 0.0–2.0)
BICARBONATE: 14.2 mmol/L — AB (ref 20.0–28.0)
O2 SAT: 85.4 %
PCO2 VEN: 26.7 mmHg — AB (ref 44.0–60.0)
PO2 VEN: 56.2 mmHg — AB (ref 32.0–45.0)
Patient temperature: 98.6
pH, Ven: 7.344 (ref 7.250–7.430)

## 2018-02-14 LAB — MAGNESIUM: Magnesium: 1.3 mg/dL — ABNORMAL LOW (ref 1.7–2.4)

## 2018-02-14 LAB — LIPASE, BLOOD: Lipase: 43 U/L (ref 11–51)

## 2018-02-14 MED ORDER — ACETAMINOPHEN 500 MG PO TABS: 1000.0000 mg | ORAL_TABLET | Freq: Two times a day (BID) | ORAL | Status: DC | PRN

## 2018-02-14 MED ORDER — GLUCERNA SHAKE PO LIQD
237.0000 mL | Freq: Three times a day (TID) | ORAL | Status: DC
  Administered 2018-02-14 – 2018-02-16 (×6): 237 mL via ORAL
  Filled 2018-02-14 (×7): qty 237

## 2018-02-14 MED ORDER — INSULIN ASPART 100 UNIT/ML ~~LOC~~ SOLN: 0.0000 [IU] | Freq: Every day | SUBCUTANEOUS | Status: DC

## 2018-02-14 MED ORDER — DIPHENOXYLATE-ATROPINE 2.5-0.025 MG PO TABS
1.0000 | ORAL_TABLET | Freq: Four times a day (QID) | ORAL | Status: DC
  Administered 2018-02-14 – 2018-02-16 (×7): 1 via ORAL
  Filled 2018-02-14 (×7): qty 1

## 2018-02-14 MED ORDER — VITAMIN D3 25 MCG (1000 UNIT) PO TABS
1000.0000 [IU] | ORAL_TABLET | Freq: Every day | ORAL | Status: DC
  Administered 2018-02-15 – 2018-02-16 (×2): 1000 [IU] via ORAL
  Filled 2018-02-14 (×2): qty 1

## 2018-02-14 MED ORDER — INSULIN ASPART 100 UNIT/ML ~~LOC~~ SOLN: 0.0000 [IU] | Freq: Three times a day (TID) | SUBCUTANEOUS | Status: DC

## 2018-02-14 MED ORDER — SODIUM CHLORIDE 0.9 % IV BOLUS
1000.0000 mL | Freq: Once | INTRAVENOUS | Status: AC
  Administered 2018-02-14: 1000 mL via INTRAVENOUS

## 2018-02-14 MED ORDER — ATORVASTATIN CALCIUM 20 MG PO TABS
20.0000 mg | ORAL_TABLET | Freq: Every day | ORAL | Status: DC
  Administered 2018-02-14 – 2018-02-16 (×3): 20 mg via ORAL
  Filled 2018-02-14 (×3): qty 1

## 2018-02-14 MED ORDER — CALCIUM CARBONATE 1250 (500 CA) MG PO TABS
600.0000 mg | ORAL_TABLET | Freq: Two times a day (BID) | ORAL | Status: DC
  Administered 2018-02-15 – 2018-02-16 (×3): 625 mg via ORAL
  Filled 2018-02-14 (×3): qty 1

## 2018-02-14 MED ORDER — APIXABAN 5 MG PO TABS
5.0000 mg | ORAL_TABLET | Freq: Two times a day (BID) | ORAL | Status: DC
  Administered 2018-02-14 – 2018-02-16 (×4): 5 mg via ORAL
  Filled 2018-02-14 (×4): qty 1

## 2018-02-14 MED ORDER — LOPERAMIDE HCL 2 MG PO CAPS
2.0000 mg | ORAL_CAPSULE | ORAL | Status: DC | PRN
  Administered 2018-02-15 – 2018-02-16 (×7): 2 mg via ORAL
  Filled 2018-02-14 (×7): qty 1

## 2018-02-14 MED ORDER — LORATADINE 10 MG PO TABS
10.0000 mg | ORAL_TABLET | Freq: Every day | ORAL | Status: DC
  Administered 2018-02-15 – 2018-02-16 (×2): 10 mg via ORAL
  Filled 2018-02-14 (×2): qty 1

## 2018-02-14 MED ORDER — POTASSIUM CHLORIDE IN NACL 40-0.9 MEQ/L-% IV SOLN
INTRAVENOUS | Status: DC
  Administered 2018-02-14 – 2018-02-16 (×4): 75 mL/h via INTRAVENOUS
  Filled 2018-02-14 (×5): qty 1000

## 2018-02-14 MED ORDER — MOMETASONE FURO-FORMOTEROL FUM 200-5 MCG/ACT IN AERO
2.0000 | INHALATION_SPRAY | Freq: Two times a day (BID) | RESPIRATORY_TRACT | Status: DC
  Administered 2018-02-14 – 2018-02-16 (×4): 2 via RESPIRATORY_TRACT
  Filled 2018-02-14: qty 8.8

## 2018-02-14 MED ORDER — B COMPLEX VITAMINS PO CAPS: 1.0000 | ORAL_CAPSULE | Freq: Every day | ORAL | Status: DC

## 2018-02-14 MED ORDER — FENOFIBRATE 160 MG PO TABS
160.0000 mg | ORAL_TABLET | Freq: Every day | ORAL | Status: DC
  Administered 2018-02-15 – 2018-02-16 (×2): 160 mg via ORAL
  Filled 2018-02-14 (×2): qty 1

## 2018-02-14 MED ORDER — FAMOTIDINE 20 MG PO TABS
20.0000 mg | ORAL_TABLET | Freq: Two times a day (BID) | ORAL | Status: DC
  Administered 2018-02-14 – 2018-02-16 (×4): 20 mg via ORAL
  Filled 2018-02-14 (×4): qty 1

## 2018-02-14 MED ORDER — GABAPENTIN 100 MG PO CAPS
200.0000 mg | ORAL_CAPSULE | Freq: Every day | ORAL | Status: DC
  Administered 2018-02-15 – 2018-02-16 (×2): 200 mg via ORAL
  Filled 2018-02-14 (×2): qty 2

## 2018-02-14 MED ORDER — METOPROLOL TARTRATE 25 MG PO TABS
12.5000 mg | ORAL_TABLET | Freq: Two times a day (BID) | ORAL | Status: DC
  Administered 2018-02-14 – 2018-02-16 (×4): 12.5 mg via ORAL
  Filled 2018-02-14 (×4): qty 1

## 2018-02-14 MED ORDER — ASPIRIN EC 325 MG PO TBEC
325.0000 mg | DELAYED_RELEASE_TABLET | Freq: Every day | ORAL | Status: DC
  Administered 2018-02-15 – 2018-02-16 (×2): 325 mg via ORAL
  Filled 2018-02-14 (×2): qty 1

## 2018-02-14 MED ORDER — CHOLESTYRAMINE 4 G PO PACK
4.0000 g | PACK | Freq: Two times a day (BID) | ORAL | Status: DC
  Administered 2018-02-14 – 2018-02-16 (×4): 4 g via ORAL
  Filled 2018-02-14 (×5): qty 1

## 2018-02-14 MED ORDER — MAGNESIUM SULFATE 2 GM/50ML IV SOLN
2.0000 g | Freq: Once | INTRAVENOUS | Status: AC
  Administered 2018-02-14: 2 g via INTRAVENOUS
  Filled 2018-02-14: qty 50

## 2018-02-14 MED ORDER — OXYCODONE-ACETAMINOPHEN 5-325 MG PO TABS: 1.0000 | ORAL_TABLET | Freq: Two times a day (BID) | ORAL | Status: DC | PRN

## 2018-02-14 MED ORDER — GABAPENTIN 100 MG PO CAPS: 100.0000 mg | ORAL_CAPSULE | Freq: Three times a day (TID) | ORAL | Status: DC

## 2018-02-14 MED ORDER — SODIUM CHLORIDE 0.9 % IV BOLUS: 1000.0000 mL | Freq: Once | INTRAVENOUS | Status: DC

## 2018-02-14 MED ORDER — ONDANSETRON HCL 4 MG PO TABS
4.0000 mg | ORAL_TABLET | Freq: Four times a day (QID) | ORAL | Status: DC | PRN
Start: 2018-02-14 — End: 2018-02-16

## 2018-02-14 MED ORDER — B COMPLEX-C PO TABS
1.0000 | ORAL_TABLET | Freq: Every day | ORAL | Status: DC
  Administered 2018-02-15 – 2018-02-16 (×2): 1 via ORAL
  Filled 2018-02-14 (×2): qty 1

## 2018-02-14 MED ORDER — GABAPENTIN 100 MG PO CAPS
100.0000 mg | ORAL_CAPSULE | ORAL | Status: DC
  Administered 2018-02-14 – 2018-02-16 (×4): 100 mg via ORAL
  Filled 2018-02-14 (×4): qty 1

## 2018-02-14 NOTE — ED Notes (Signed)
ED TO INPATIENT HANDOFF REPORT  Name/Age/Gender Gerald Bullock 72 y.o. male  Code Status    Code Status Orders  (From admission, onward)        Start     Ordered   02/14/18 1748  Full code  Continuous     02/14/18 1757    Code Status History    Date Active Date Inactive Code Status Order ID Comments User Context   01/29/2018 1510 01/31/2018 1604 Full Code [id number]  Janece Canterbury, MD Inpatient      Home/SNF/Other Home  Chief Complaint diarrhea  Level of Care/Admitting Diagnosis ED Disposition    ED Disposition Condition Comment   [place] [facility]: [facility] [[id number]]  Level of Care: Telemetry [5]  Admit to tele based on following criteria: Monitor QTC interval  Diagnosis: Hypomagnesemia [428768]  Admitting Physician: Patrecia Pour, EDWIN [[id number]]  Attending Physician: Patrecia Pour, EDWIN [[id number]]  PT Class (Do Not Modify): Observation [104]  PT Acc Code (Do Not Modify): Observation [10022]       Medical History Past Medical History:  Diagnosis Date  . COPD (chronic obstructive pulmonary disease) ([place])   . Diabetes mellitus type 2 in obese ([place])   . Essential hypertension   . GERD (gastroesophageal reflux disease)   . History of aortic valve replacement    bovine  . Melanoma ([place])    removed from left arm  . OSA (obstructive sleep apnea)   . Pulmonary embolism ([place])   . 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 Hydrocodone     Hallucinations, but tolerated codeine before   . Varenicline     Hallucinations and respiratory distress     IV Location/Drains/Wounds Patient Lines/Drains/Airways Status   Active Line/Drains/Airways    Name:   Placement date:   Placement time:   Site:   Days:   Peripheral IV 02/14/18 Right Hand   02/14/18    1711    Hand   less than 1   Incision (Closed) 01/29/18 Abdomen Mid   01/29/18    1430     16          Labs/Imaging Results  for orders placed or performed during the hospital encounter of 02/14/18 (from the past 48 hour(s))  Lipase, blood     Status: None   Collection Time: 02/14/18 12:57 PM  Result Value Ref Range   Lipase 43 11 - 51 U/L    Comment: Performed at [facility], [place] [address] [place],  [address]  Comprehensive metabolic panel     Status: Abnormal   Collection Time: 02/14/18 12:57 PM  Result Value Ref Range   Sodium 136 135 - 145 mmol/L   Potassium 4.4 3.5 - 5.1 mmol/L   Chloride 111 101 - 111 mmol/L   CO2 15 (L) 22 - 32 mmol/L   Glucose, Bld 130 (H) 65 - 99 mg/dL   BUN 22 (H) 6 - 20 mg/dL   Creatinine, Ser 1.17 0.61 - 1.24 mg/dL   Calcium 9.3 8.9 - 10.3 mg/dL   Total Protein 7.5 6.5 - 8.1 g/dL   Albumin 3.5 3.5 - 5.0 g/dL   AST 78 (H) 15 - 41 U/L   ALT 103 (H) 17 - 63 U/L   Alkaline Phosphatase 161 (H) 38 - 126 U/L   Total Bilirubin 0.6 0.3 - 1.2 mg/dL   GFR calc non Af Amer >60 >60 mL/min   GFR  calc Af Amer >60 >60 mL/min    Comment: (NOTE) The eGFR has been calculated using the CKD EPI equation. This calculation has not been validated in all clinical situations. eGFR's persistently <60 mL/min signify possible Chronic Kidney Disease.    Anion gap 10 5 - 15    Comment: Performed at [facility], [place] [address] [place], [place] [address]  CBC     Status: Abnormal   Collection Time: 02/14/18 12:57 PM  Result Value Ref Range   WBC 8.7 4.0 - 10.5 K/uL   RBC 4.92 4.22 - 5.81 MIL/uL   Hemoglobin 13.7 13.0 - 17.0 g/dL   HCT 41.6 39.0 - 52.0 %   MCV 84.6 78.0 - 100.0 fL   MCH 27.8 26.0 - 34.0 pg   MCHC 32.9 30.0 - 36.0 g/dL   RDW 15.7 (H) 11.5 - 15.5 %   Platelets 348 150 - 400 K/uL    Comment: Performed at [facility], [place] [address] [place], [place] [address]  Magnesium     Status: Abnormal   Collection Time: 02/14/18  4:20 PM  Result Value Ref Range   Magnesium 1.3 (L) 1.7 - 2.4 mg/dL    Comment: Performed at  [facility], [place] [address] [place], [place] [address]  Blood gas, venous     Status: Abnormal   Collection Time: 02/14/18  4:49 PM  Result Value Ref Range   pH, Ven 7.344 7.250 - 7.430   pCO2, Ven 26.7 (L) 44.0 - 60.0 mmHg   pO2, Ven 56.2 (H) 32.0 - 45.0 mmHg   Bicarbonate 14.2 (L) 20.0 - 28.0 mmol/L   Acid-base deficit 9.8 (H) 0.0 - 2.0 mmol/L   O2 Saturation 85.4 %   Patient temperature 98.6    Collection site VEIN    Drawn by COLLECTED BY NURSE     Comment: Performed at [place] [address] [place], [place] [address]   Dg Abd Acute W/chest  Result Date: 02/14/2018 CLINICAL DATA:  Pt has had diarrhea since bowel reconstruction on March 2nd, today diarrhea worse along with weight loss and fatigue. Denies abdominal pain. H/o diabetes, COPD, Pulmonary embolism. Former smoker. EXAM: DG ABDOMEN ACUTE W/ 1V CHEST COMPARISON:  12/25/2017 FINDINGS: Status post median sternotomy and CABG. The heart is normal in size. There are slightly prominent markings at the lung bases, likely chronic. No new consolidations. No pleural effusion. Supine and erect views of the abdomen showed no free intraperitoneal air. There is a nonobstructive bowel gas pattern. Surgical clips are present in the RIGHT UPPER QUADRANT the abdomen. No evidence for organomegaly. Visualized osseous structures have a normal appearance. IMPRESSION: Negative abdominal radiographs.  No acute cardiopulmonary disease. Electronically Signed   By: Nolon Nations M.D.   On: 02/14/2018 16:52    Pending Labs Unresulted Labs (From admission, onward)   Start     Ordered   02/15/18 0500  Comprehensive metabolic panel  Tomorrow morning,   R     02/14/18 1757   02/15/18 0500  CBC  Tomorrow morning,   R     02/14/18 1757   02/15/18 0500  Magnesium  Tomorrow morning,   R     02/14/18 1757   02/15/18 0500  Vitamin B12  Tomorrow morning,   R     02/14/18 1757   02/15/18 0500  Prealbumin  Tomorrow  morning,   R     02/14/18 1757   02/14/18 1650  C difficile quick scan w PCR reflex  (C Difficile quick screen w PCR reflex panel)  Once, for 24 hours,   R     02/14/18 1649   02/14/18 1649  Gastrointestinal Panel by PCR , Stool  (Gastrointestinal Panel by PCR, Stool)  Once,   R     02/14/18 1649   02/14/18 1243  Urinalysis, Routine w reflex microscopic  STAT,   STAT     02/14/18 1242      Vitals/Pain Today's Vitals   02/14/18 1454 02/14/18 1600 02/14/18 1604 02/14/18 1748  BP: 122/76 119/73  104/84  Pulse: 88 93  80  Resp: _0 Temp: 97.7 F (36.5 C) (!) 97.5 F (36.4 C)    TempSrc: Oral Oral    SpO2: 97% 96%  96%  Weight:      Height:      PainSc:   0-No pain     Isolation Precautions Enteric precautions (UV disinfection)  Medications Medications  magnesium sulfate IVPB 2 g 50 mL (2 g Intravenous New Bag/Given 02/14/18 1737)  sodium chloride 0.9 % bolus 1,000 mL (has no administration in time range)  acetaminophen (TYLENOL) tablet 1,000 mg (has no administration in time range)  apixaban (ELIQUIS) tablet 5 mg (has no administration in time range)  aspirin EC tablet 325 mg (has no administration in time range)  atorvastatin (LIPITOR) tablet 20 mg (has no administration in time range)  b complex vitamins capsule 1 capsule (has no administration in time range)  mometasone-formoterol (DULERA) 200-5 MCG/ACT inhaler 2 puff (has no administration in time range)  calcium carbonate (OS-CAL) tablet 600 mg (has no administration in time range)  cholecalciferol (VITAMIN D) tablet 1,000 Units (has no administration in time range)  cholestyramine (QUESTRAN) packet 4 g (has no administration in time range)  diphenoxylate-atropine (LOMOTIL) 2.5-0.025 MG per tablet 1 tablet (has no administration in time range)  famotidine (PEPCID) tablet 20 mg (has no administration in time range)  feeding supplement (GLUCERNA SHAKE) (GLUCERNA SHAKE) liquid 237 mL (has no administration in time  range)  fenofibrate tablet 160 mg (has no administration in time range)  gabapentin (NEURONTIN) capsule 100-200 mg (has no administration in time range)  loperamide (IMODIUM) capsule 2 mg (has no administration in time range)  loratadine (CLARITIN) tablet 10 mg (has no administration in time range)  metoprolol tartrate (LOPRESSOR) tablet 12.5 mg (has no administration in time range)  ondansetron (ZOFRAN) tablet 4 mg (has no administration in time range)  oxyCODONE-acetaminophen (PERCOCET/ROXICET) 5-325 MG per tablet 1 tablet (has no administration in time range)  0.9 % NaCl with KCl 40 mEq / L  infusion (has no administration in time range)  insulin aspart (novoLOG) injection 0-15 Units (has no administration in time range)  insulin aspart (novoLOG) injection 0-5 Units (has no administration in time range)  sodium chloride 0.9 % bolus 1,000 mL (0 mLs Intravenous Stopped 02/14/18 1813)    Mobility walks

## 2018-02-14 NOTE — ED Notes (Signed)
Pt aware urine sample is needed, states that fluids are needed first.

## 2018-02-14 NOTE — ED Triage Notes (Signed)
Pt has had diarrhea since bowel reconstruction on March 2nd, today diarrhea worse along with weight loss and fatigue

## 2018-02-14 NOTE — ED Provider Notes (Signed)
[facility] DEPT Provider Note   CSN: [id number] Arrival date & time: 02/14/18  1119     History   Chief Complaint Chief Complaint  Patient presents with  . Diarrhea  . Weight Loss  . Fatigue    HPI Gerald Bullock is a 72 y.o. male hx of COPD, DM, HTN, GERD, partial small bowel resection with SMA embolectomy at [facility] here with diarrhea.  Patient was admitted to the hospital about 2 weeks ago for dumping syndrome after bowel resection.  Patient has been having about 8-10 episodes of diarrhea daily.  This morning, he had watery diarrhea.  He had 3 episodes of bowel incontinence.  He also had at least 15 episodes of watery diarrhea.  Patient states that he lost about 3 pounds since yesterday from the diarrhea alone.  He had seen GI recently and had labs that showed low magnesium.   The history is provided by the patient.    Past Medical History:  Diagnosis Date  . COPD (chronic obstructive pulmonary disease) ([place])   . Diabetes mellitus type 2 in obese ([place])   . Essential hypertension   . GERD (gastroesophageal reflux disease)   . History of aortic valve replacement    bovine  . Melanoma ([place])    removed from left arm  . OSA (obstructive sleep apnea)   . Pulmonary embolism ([place])   . 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 Malnutrition of moderate degree 01/30/2018  . Hypomagnesemia 01/29/2018  . Hypokalemia 01/29/2018  . Hypocalcemia 01/29/2018  . Generalized weakness 01/29/2018  . OSA (obstructive sleep apnea)   . GERD (gastroesophageal reflux disease)   . Essential hypertension   . Diabetes mellitus type 2 in obese ([place])   . COPD (chronic obstructive pulmonary disease) ([place])   . Superior mesenteric artery thrombosis ([place]) 12/26/2017    Past Surgical History:  Procedure Laterality Date  . AORTIC VALVE REPLACEMENT     bovine  . ARTERIAL THROMBECTOMY     SMA  thrombectomy  . CHOLECYSTECTOMY    . COLON SURGERY    . partial small bowel resection    . PILONIDAL CYST EXCISION     in early 20s        Home Medications    Prior to Admission medications   Medication Sig Start Date End Date Taking? Authorizing Provider  acetaminophen (TYLENOL) 500 MG tablet Take 1,000 mg by mouth 2 (two) times daily as needed for mild pain.    [provider]  apixaban (ELIQUIS) 5 MG TABS tablet Take 1 tablet (5 mg total) by mouth 2 (two) times daily. 01/31/18   Bonnielee Haff, MD  aspirin EC 325 MG tablet Take 1 tablet (325 mg total) by mouth daily. 01/31/18   Bonnielee Haff, MD  atorvastatin (LIPITOR) 20 MG tablet Take 1 tablet (20 mg total) by mouth daily. 01/31/18   Bonnielee Haff, MD  b complex vitamins capsule Take 1 capsule by mouth daily. 01/31/18   Bonnielee Haff, MD  budesonide-formoterol [facility]) 160-4.5 MCG/ACT inhaler Inhale 2 puffs into the lungs 2 (two) times daily. 01/31/18   Bonnielee Haff, MD  calcium carbonate (OS-CAL) 600 MG TABS tablet Take 1 tablet (600 mg total) by mouth 2 (two) times daily with a meal. 01/31/18   Bonnielee Haff, MD  cholecalciferol (VITAMIN D) 1000 units tablet Take 1,000 Units by mouth daily.    [provider]  cholestyramine Lucrezia Starch)  4 g packet Take 1 packet (4 g total) by mouth 2 (two) times daily. 01/31/18   Bonnielee Haff, MD  clobetasol cream (TEMOVATE) 0.34 % Apply 1 application topically daily as needed (rash).    [provider]  diphenoxylate-atropine (LOMOTIL) 2.5-0.025 MG tablet Take 1 tablet by mouth 4 (four) times daily as needed for diarrhea or loose stools. 01/31/18   Bonnielee Haff, MD  famotidine (PEPCID) 20 MG tablet Take 1 tablet (20 mg total) by mouth 2 (two) times daily. 01/31/18   Bonnielee Haff, MD  feeding supplement, GLUCERNA SHAKE, (GLUCERNA SHAKE) LIQD Take 237 mLs by mouth 3 (three) times daily between meals. 01/31/18   Bonnielee Haff, MD  fenofibrate 160 MG tablet Take 1  tablet (160 mg total) by mouth daily. 01/31/18   Bonnielee Haff, MD  gabapentin (NEURONTIN) 100 MG capsule Take 1-2 capsules (100-200 mg total) by mouth 3 (three) times daily. 200 MG once daily in AM, 100 MG at noon and 100 MG at bedtime 01/31/18   Bonnielee Haff, MD  loperamide (IMODIUM) 2 MG capsule Take 2 capsules (4 mg total) by mouth 3 (three) times daily before meals. 01/31/18   Bonnielee Haff, MD  loratadine (CLARITIN) 10 MG tablet Take 10 mg by mouth daily.    [provider]  magnesium oxide (MAG-OX) 400 MG tablet Take 1 tablet (400 mg total) by mouth daily. Change to every other day if diarrhea worsens 01/31/18   Bonnielee Haff, MD  metoprolol tartrate (LOPRESSOR) 25 MG tablet Take 0.5 tablets (12.5 mg total) by mouth 2 (two) times daily. 01/31/18   Bonnielee Haff, MD  ondansetron (ZOFRAN) 4 MG tablet Take 1 tablet (4 mg total) by mouth every 6 (six) hours as needed for nausea or vomiting. 01/31/18   Bonnielee Haff, MD  oxyCODONE-acetaminophen (PERCOCET/ROXICET) 5-325 MG tablet Take 1 tablet by mouth 2 (two) times daily as needed for severe pain. 01/31/18   Bonnielee Haff, MD  potassium chloride (K-DUR,KLOR-CON) 20 MEQ tablet Take 1 tablet (20 mEq total) by mouth 2 (two) times daily. 01/31/18   Bonnielee Haff, MD  psyllium (HYDROCIL/METAMUCIL) 95 % PACK Take 1 packet by mouth 2 (two) times daily. 01/31/18   Bonnielee Haff, MD    Family History Family History  Problem Relation Age of Onset  . Breast cancer Mother   . Lung cancer Mother   . Heart disease Father   . Heart attack Father   . Diabetes Brother   . Hypertension Brother   . COPD Brother   . Heart disease Brother     Social History Social History   Tobacco Use  . Smoking status: Former Smoker    Packs/day: 2.00    Years: 36.00    Pack years: 72.00    Types: Cigarettes  . Smokeless tobacco: Never Used  Substance Use Topics  . Alcohol use: Yes    Frequency: Never    Comment: social (none recently)  . Drug use:  Never     Allergies   Hydrocodone and Varenicline   Review of Systems Review of Systems  Gastrointestinal: Positive for diarrhea.  All other systems reviewed and are negative.    Physical Exam Updated Vital Signs BP 119/73 (BP Location: Right Arm)   Pulse 93   Temp (!) 97.5 F (36.4 C) (Oral)   Resp 18   Ht 6\' 2"  (1.88 m)   Wt 108 kg (238 lb)   SpO2 96%   BMI 30.56 kg/m   Physical Exam  Constitutional: He  is oriented to person, place, and time.  Dehydrated   HENT:  Head: Normocephalic.  MM dry   Eyes: Pupils are equal, round, and reactive to light. Conjunctivae and EOM are normal.  Neck: Normal range of motion. Neck supple.  Cardiovascular: Normal rate, regular rhythm and normal heart sounds.  Pulmonary/Chest: Effort normal and breath sounds normal. No stridor. No respiratory distress. He has no wheezes.  Abdominal: Soft. Bowel sounds are normal.  Midline abdominal scar healing well, nontender   Musculoskeletal: Normal range of motion.  Neurological: He is alert and oriented to person, place, and time.  Skin: Skin is warm.  Psychiatric: He has a normal mood and affect.  Nursing note and vitals reviewed.    ED Treatments / Results  Labs (all labs ordered are listed, but only abnormal results are displayed) Labs Reviewed  COMPREHENSIVE METABOLIC PANEL - Abnormal; Notable for the following components:      Result Value   CO2 15 (*)    Glucose, Bld 130 (*)    BUN 22 (*)    AST 78 (*)    ALT 103 (*)    Alkaline Phosphatase 161 (*)    All other components within normal limits  CBC - Abnormal; Notable for the following components:   RDW 15.7 (*)    All other components within normal limits  MAGNESIUM - Abnormal; Notable for the following components:   Magnesium 1.3 (*)    All other components within normal limits  GASTROINTESTINAL PANEL BY PCR, STOOL (REPLACES STOOL CULTURE)  C DIFFICILE QUICK SCREEN W PCR REFLEX  LIPASE, BLOOD  URINALYSIS, ROUTINE W  REFLEX MICROSCOPIC  BLOOD GAS, VENOUS    EKG None  Radiology No results found.  Procedures Procedures (including critical care time)  Medications Ordered in ED Medications  sodium chloride 0.9 % bolus 1,000 mL (has no administration in time range)  magnesium sulfate IVPB 2 g 50 mL (has no administration in time range)     Initial Impression / Assessment and Plan / ED Course  I have reviewed the triage vital signs and the nursing notes.  Pertinent labs & imaging results that were available during my care of the patient were reviewed by me and considered in my medical decision making (see chart for details).    Dontavious Emily is a 72 y.o. male here with diarrhea, weight loss. Has hx of dumping syndrome. Will check chemistry, magnesium. Abdomen nontender and had recent CT that was unremarkable. Low suspicion for SBO so if xray unremarkable, won't need CT. Low suspicion for infectious diarrhea but will repeat C diff, GI pathogen panel.   5:00 PM CO2 15 likely from diarrhea. Magnesium 1.3, supplemented. Xray showed no SBO. Given IVF. Will admit for dehydration likely from recurrent dumping syndrome.    Final Clinical Impressions(s) / ED Diagnoses   Final diagnoses:  None    ED Discharge Orders    None       Drenda Freeze, MD 02/14/18 1701

## 2018-02-14 NOTE — H&P (Signed)
History and Physical    Gerald Bullock JSH:[id number] DOB: Aug 22, 1946  DOA: 02/14/2018 PCP: [facility]  Patient coming from: Home  Chief Complaint: Home  HPI: Gerald Bullock is a 72 y.o. 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 long hospital 4/6 2019 where he was admitted for electrolyte abnormalities due to diarrhea.  Patient presented today to the emergency department complaining of fatigue, weight loss and persistent diarrhea.  Patient was diagnosed with dumping syndrome after bowel resection and has been followed by GI.  Patient taking Imodium, Lomotil and cholestyramine.  Patient reported the symptoms worsen about 2 days prior to admission having increasing watery diarrhea and he lost 3 pounds since yesterday.  He also reports feeling malaise and fatigue and with poor appetite.  Medication has not helped.  Denies other associated symptoms like abdominal pain, nausea and vomiting.  Saw GI doctor 2 days ago where magnesium was low and has been repleted orally.   ED Course: CO2 15, creatinine up to 1.1 from 0.8, magnesium 1.3, ALP 161, abdominal x-ray negative.  ABG 7.34/26.7/56.2/14.2.  Vital signs stable  Review of Systems:  Denies fever, shortness of breath, dysuria, chest pain, lower extremity neuropathy, dysuria and abdominal pain Complete 12 point review of system reviewed with patient which are negative except as mentioned in HPI.   Past Medical History:  Diagnosis Date  . COPD (chronic obstructive pulmonary disease) ([place])   . Diabetes mellitus type 2 in obese ([place])   . Essential hypertension   . GERD (gastroesophageal reflux disease)   . History of aortic valve replacement    bovine  . Melanoma ([place])    removed  from left arm  . OSA (obstructive sleep apnea)   . Pulmonary embolism ([place])   . 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 AORTIC VALVE REPLACEMENT     bovine  . ARTERIAL THROMBECTOMY     SMA thrombectomy  . CHOLECYSTECTOMY    . COLON SURGERY    . partial small bowel resection    . PILONIDAL CYST EXCISION     in early 20s     reports that he has quit smoking. His smoking use included cigarettes. He has a 72.00 pack-year smoking history. He has never used smokeless tobacco. He reports that he drinks alcohol. He reports that he does not use drugs.  Allergies  Allergen Reactions  . Hydrocodone     Hallucinations, but tolerated codeine before   . Varenicline     Hallucinations and respiratory distress     Family History  Problem Relation Age of Onset  . Breast cancer Mother   . Lung cancer Mother   . Heart disease Father   . Heart attack Father   . Diabetes Brother   . Hypertension Brother   . COPD Brother   . Heart disease Brother     Prior to Admission medications   Medication Sig Start Date End Date Taking? Authorizing Provider  acetaminophen (TYLENOL) 500 MG tablet Take 1,000 mg by mouth 2 (two) times daily as needed for mild pain.   Yes [provider]  apixaban (ELIQUIS) 5 MG TABS tablet Take 1 tablet (5 mg total) by mouth 2 (two) times daily. 01/31/18  Yes Bonnielee Haff, MD  aspirin EC 325 MG tablet Take 1 tablet (325 mg total) by mouth daily. 01/31/18  Yes Bonnielee Haff, MD  atorvastatin (LIPITOR) 20 MG tablet Take 1 tablet (20 mg total) by mouth daily. 01/31/18  Yes Bonnielee Haff, MD  b complex vitamins capsule Take 1 capsule by mouth daily. 01/31/18  Yes Bonnielee Haff, MD  budesonide-formoterol [facility]) 160-4.5 MCG/ACT inhaler Inhale 2 puffs into the lungs 2 (two) times daily. 01/31/18  Yes Bonnielee Haff, MD  calcium carbonate (OS-CAL) 600 MG TABS tablet Take 1 tablet (600 mg total)  by mouth 2 (two) times daily with a meal. 01/31/18  Yes Bonnielee Haff, MD  cholecalciferol (VITAMIN D) 1000 units tablet Take 1,000 Units by mouth daily.   Yes [provider]  cholestyramine (QUESTRAN) 4 g packet Take 1 packet (4 g total) by mouth 2 (two) times daily. 01/31/18  Yes Bonnielee Haff, MD  diphenoxylate-atropine (LOMOTIL) 2.5-0.025 MG tablet Take 1 tablet by mouth 4 (four) times daily as needed for diarrhea or loose stools. 01/31/18  Yes Bonnielee Haff, MD  famotidine (PEPCID) 20 MG tablet Take 1 tablet (20 mg total) by mouth 2 (two) times daily. 01/31/18  Yes Bonnielee Haff, MD  feeding supplement, GLUCERNA SHAKE, (GLUCERNA SHAKE) LIQD Take 237 mLs by mouth 3 (three) times daily between meals. 01/31/18  Yes Bonnielee Haff, MD  fenofibrate 160 MG tablet Take 1 tablet (160 mg total) by mouth daily. 01/31/18  Yes Bonnielee Haff, MD  gabapentin (NEURONTIN) 100 MG capsule Take 1-2 capsules (100-200 mg total) by mouth 3 (three) times daily. 200 MG once daily in AM, 100 MG at noon and 100 MG at bedtime 01/31/18  Yes Bonnielee Haff, MD  loperamide (IMODIUM) 2 MG capsule Take 2 capsules (4 mg total) by mouth 3 (three) times daily before meals. 01/31/18  Yes Bonnielee Haff, MD  loratadine (CLARITIN) 10 MG tablet Take 10 mg by mouth daily.   Yes [provider]  magnesium oxide (MAG-OX) 400 MG tablet Take 1 tablet (400 mg total) by mouth daily. Change to every other day if diarrhea worsens Patient taking differently: Take 400 mg by mouth 3 (three) times daily. Change to every other day if diarrhea worsens 01/31/18  Yes Bonnielee Haff, MD  metoprolol tartrate (LOPRESSOR) 25 MG tablet Take 0.5 tablets (12.5 mg total) by mouth 2 (two) times daily. 01/31/18  Yes Bonnielee Haff, MD  nystatin cream (MYCOSTATIN) Apply 1 application topically 2 (two) times daily.   Yes [provider]  ondansetron (ZOFRAN) 4 MG tablet Take 1 tablet (4 mg total) by mouth every 6 (six) hours as needed for  nausea or vomiting. 01/31/18  Yes Bonnielee Haff, MD  oxyCODONE-acetaminophen (PERCOCET/ROXICET) 5-325 MG tablet Take 1 tablet by mouth 2 (two) times daily as needed for severe pain. 01/31/18  Yes Bonnielee Haff, MD  potassium chloride (K-DUR,KLOR-CON) 20 MEQ tablet Take 1 tablet (20 mEq total) by mouth 2 (two) times daily. 01/31/18  Yes Bonnielee Haff, MD  psyllium (HYDROCIL/METAMUCIL) 95 % PACK Take 1 packet by mouth 2 (two) times daily. 01/31/18  Yes Bonnielee Haff, MD    Physical Exam: Vitals:   02/14/18 1207 02/14/18 1454 02/14/18 1600 02/14/18 1748  BP: 116/75 122/76 119/73 104/84  Pulse: 88 88 93 80  Resp: 16 17 18 16   Temp: (!) 97.4 F (36.3 C) 97.7 F (36.5 C) (!) 97.5 F (36.4 C)   TempSrc: Oral Oral Oral   SpO2: 97% 97% 96% 96%  Weight: 108  kg (238 lb)     Height: 6\' 2"  (1.88 m)        Constitutional: NAD, calm, comfortable Eyes: PERRL, lids and conjunctivae normal ENMT: Mucous membranes are moist. Posterior pharynx clear of any exudate or lesions Neck: normal, supple, no masses, no thyromegaly Respiratory: clear to auscultation bilaterally, no wheezing, no crackles.  Cardiovascular: Regular rate and rhythm, no murmurs / rubs / gallops. No extremity edema. 2+ pedal pulses. No carotid bruits.  Abdomen: Midline scar,no tenderness, no masses. No hepatosplenomegaly. + Bowel sounds Musculoskeletal: no clubbing / cyanosis. No joint deformity upper and lower extremities. Good ROM Skin: Erythematous lesion between skin fold and bilateral groin with satellite lesions Neurologic: CN 2-12 grossly intact.  Psychiatric: Normal judgment and insight. Alert and oriented x 3. Normal mood.    Labs on Admission: I have personally reviewed following labs and imaging studies  CBC: Recent Labs  Lab 02/14/18 1257  WBC 8.7  HGB 13.7  HCT 41.6  MCV 84.6  PLT 332   Basic Metabolic Panel: Recent Labs  Lab 02/14/18 1257 02/14/18 1620  NA 136  --   K 4.4  --   CL 111  --   CO2 15*   --   GLUCOSE 130*  --   BUN 22*  --   CREATININE 1.17  --   CALCIUM 9.3  --   MG  --  1.3*   GFR: Estimated Creatinine Clearance: 76.9 mL/min (by C-G formula based on SCr of 1.17 mg/dL). Liver Function Tests: Recent Labs  Lab 02/14/18 1257  AST 78*  ALT 103*  ALKPHOS 161*  BILITOT 0.6  PROT 7.5  ALBUMIN 3.5   Recent Labs  Lab 02/14/18 1257  LIPASE 43   No results for input(s): AMMONIA in the last 168 hours. Coagulation Profile: No results for input(s): INR, PROTIME in the last 168 hours. Cardiac Enzymes: No results for input(s): CKTOTAL, CKMB, CKMBINDEX, TROPONINI in the last 168 hours. BNP (last 3 results) No results for input(s): PROBNP in the last 8760 hours. HbA1C: No results for input(s): HGBA1C in the last 72 hours. CBG: No results for input(s): GLUCAP in the last 168 hours. Lipid Profile: No results for input(s): CHOL, HDL, LDLCALC, TRIG, CHOLHDL, LDLDIRECT in the last 72 hours. Thyroid Function Tests: No results for input(s): TSH, T4TOTAL, FREET4, T3FREE, THYROIDAB in the last 72 hours. Anemia Panel: No results for input(s): VITAMINB12, FOLATE, FERRITIN, TIBC, IRON, RETICCTPCT in the last 72 hours. Urine analysis:    Component Value Date/Time   COLORURINE YELLOW 01/29/2018 0441   APPEARANCEUR CLEAR 01/29/2018 0441   LABSPEC 1.009 01/29/2018 0441   PHURINE 6.0 01/29/2018 0441   GLUCOSEU NEGATIVE 01/29/2018 0441   HGBUR NEGATIVE 01/29/2018 0441   BILIRUBINUR NEGATIVE 01/29/2018 0441   KETONESUR NEGATIVE 01/29/2018 0441   PROTEINUR NEGATIVE 01/29/2018 0441   NITRITE NEGATIVE 01/29/2018 0441   LEUKOCYTESUR NEGATIVE 01/29/2018 0441   Sepsis Labs: !!!!!!!!!!!!!!!!!!!!!!!!!!!!!!!!!!!!!!!!!!!! @LABRCNTIP (procalcitonin:4,lacticidven:4) )No results found for this or any previous visit (from the past 240 hour(s)).   Radiological Exams on Admission: Dg Abd Acute W/chest  Result Date: 02/14/2018 CLINICAL DATA:  Pt has had diarrhea since bowel reconstruction  on March 2nd, today diarrhea worse along with weight loss and fatigue. Denies abdominal pain. H/o diabetes, COPD, Pulmonary embolism. Former smoker. EXAM: DG ABDOMEN ACUTE W/ 1V CHEST COMPARISON:  12/25/2017 FINDINGS: Status post median sternotomy and CABG. The heart is normal in size. There are slightly prominent markings at the lung bases, likely chronic. No new  consolidations. No pleural effusion. Supine and erect views of the abdomen showed no free intraperitoneal air. There is a nonobstructive bowel gas pattern. Surgical clips are present in the RIGHT UPPER QUADRANT the abdomen. No evidence for organomegaly. Visualized osseous structures have a normal appearance. IMPRESSION: Negative abdominal radiographs.  No acute cardiopulmonary disease. Electronically Signed   By: Nolon Nations M.D.   On: 02/14/2018 16:52    EKG: Ordered  Assessment/Plan Diarrhea secondary to dumping syndrome Patient with recent small bowel resection, being [facility] at [facility].  GI panel was negative from previous admission, will repeat C. difficile.  Will place in observation due to electrolytes abnormalities. Will place on Lomotil 4 times daily, change Imodium to 2 mg as needed and continue cholestyramine twice daily.  Goal is to control diarrhea as much as possible.   Hypomagnesemia Secondary to ongoing diarrhea from dumping syndrome Replete magnesium, check EKG and place patient on telemetry to monitor for QTC changes. Check magnesium and CMP in a.m.  AKI with non-anion gap metabolic acidosis Due to persistent diarrhea Will place on IV fluids Avoid nephrotoxic agent and hypotension Check renal function in a.m.  Essential hypertension BP stable Continue home medications  Type 2 diabetes mellitus Monitor CBGs and placed on SSI  Partial small bowel resection with right hemicolectomy and SMA embolectomy This was done due to SMA thrombosis at [facility], patient is on aspirin  and Eliquis which will be continued.  Continue to follow-up as an outpatient  Moderate malnutrition with weight loss Due to dumping syndrome Dietitian has been consulted, continue dietary supplements  Generalized weakness and fatigue Due to electrolyte imbalance and dehydration  History of COPD  Oxygen dependent, however currently room air with normal saturations No signs of exacerbation Continue home medications  DVT prophylaxis: Eliquis Code Status: Full code  Family Communication: Wife at bedside Disposition Plan: Anticipate discharge to previous home environment.  Consults called: None Admission status: Obs/Telemetry   Chipper Oman MD [facility] Pager: Text Page via [email or web address]  [phone]  If 7PM-7AM, please contact night-coverage [email or web address] Password [facility]  02/14/2018, 5:57 PM

## 2018-02-14 NOTE — Progress Notes (Signed)
Patient admitted to [place] from Emergency Room, vital signs stable, tele applied. Patient hungry, awaiting tray from kitchen.  Wife at bedside.

## 2018-02-15 DIAGNOSIS — K911 Postgastric surgery syndromes: Secondary | ICD-10-CM | POA: Diagnosis not present

## 2018-02-15 DIAGNOSIS — N179 Acute kidney failure, unspecified: Secondary | ICD-10-CM | POA: Diagnosis not present

## 2018-02-15 DIAGNOSIS — E872 Acidosis: Secondary | ICD-10-CM | POA: Diagnosis not present

## 2018-02-15 LAB — COMPREHENSIVE METABOLIC PANEL
ALBUMIN: 3.1 g/dL — AB (ref 3.5–5.0)
ALT: 84 U/L — AB (ref 17–63)
AST: 63 U/L — ABNORMAL HIGH (ref 15–41)
Alkaline Phosphatase: 137 U/L — ABNORMAL HIGH (ref 38–126)
Anion gap: 7 (ref 5–15)
BUN: 21 mg/dL — AB (ref 6–20)
CO2: 16 mmol/L — AB (ref 22–32)
CREATININE: 0.99 mg/dL (ref 0.61–1.24)
Calcium: 8.7 mg/dL — ABNORMAL LOW (ref 8.9–10.3)
Chloride: 113 mmol/L — ABNORMAL HIGH (ref 101–111)
GFR calc Af Amer: >60 mL/min (ref >60)
GFR calc non Af Amer: >60 mL/min (ref >60)
GLUCOSE: 113 mg/dL — AB (ref 65–99)
Potassium: 4.2 mmol/L (ref 3.5–5.1)
SODIUM: 136 mmol/L (ref 135–145)
Total Bilirubin: 0.6 mg/dL (ref 0.3–1.2)
Total Protein: 6.4 g/dL — ABNORMAL LOW (ref 6.5–8.1)

## 2018-02-15 LAB — CBC
HCT: 37.2 % — ABNORMAL LOW (ref 39.0–52.0)
Hemoglobin: 11.9 g/dL — ABNORMAL LOW (ref 13.0–17.0)
MCH: 27 pg (ref 26.0–34.0)
MCHC: 32 g/dL (ref 30.0–36.0)
MCV: 84.4 fL (ref 78.0–100.0)
PLATELETS: 326 10*3/uL (ref 150–400)
RBC: 4.41 MIL/uL (ref 4.22–5.81)
RDW: 15.7 % — ABNORMAL HIGH (ref 11.5–15.5)
WBC: 7 10*3/uL (ref 4.0–10.5)

## 2018-02-15 LAB — GASTROINTESTINAL PANEL BY PCR, STOOL (REPLACES STOOL CULTURE)
ASTROVIRUS: NOT DETECTED
Adenovirus F40/41: NOT DETECTED
CYCLOSPORA CAYETANENSIS: NOT DETECTED
Campylobacter species: NOT DETECTED
Cryptosporidium: NOT DETECTED
ENTAMOEBA HISTOLYTICA: NOT DETECTED
ENTEROAGGREGATIVE E COLI (EAEC): NOT DETECTED
ENTEROTOXIGENIC E COLI (ETEC): NOT DETECTED
Enteropathogenic E coli (EPEC): NOT DETECTED
Giardia lamblia: NOT DETECTED
Norovirus GI/GII: NOT DETECTED
Plesimonas shigelloides: NOT DETECTED
Rotavirus A: NOT DETECTED
Salmonella species: NOT DETECTED
Sapovirus (I, II, IV, and V): NOT DETECTED
Shiga like toxin producing E coli (STEC): NOT DETECTED
Shigella/Enteroinvasive E coli (EIEC): NOT DETECTED
VIBRIO CHOLERAE: NOT DETECTED
VIBRIO SPECIES: NOT DETECTED
Yersinia enterocolitica: NOT DETECTED

## 2018-02-15 LAB — GLUCOSE, CAPILLARY
Glucose-Capillary: 93 mg/dL (ref 65–99)
Glucose-Capillary: 115 mg/dL — ABNORMAL HIGH (ref 65–99)
Glucose-Capillary: 109 mg/dL — ABNORMAL HIGH (ref 65–99)
Glucose-Capillary: 162 mg/dL — ABNORMAL HIGH (ref 65–99)

## 2018-02-15 LAB — MAGNESIUM: MAGNESIUM: 1.6 mg/dL — AB (ref 1.7–2.4)

## 2018-02-15 LAB — PREALBUMIN: Prealbumin: 24 mg/dL (ref 18–38)

## 2018-02-15 LAB — VITAMIN B12: Vitamin B-12: 356 pg/mL (ref 180–914)

## 2018-02-15 MED ORDER — MAGNESIUM SULFATE 4 GM/100ML IV SOLN
4.0000 g | Freq: Once | INTRAVENOUS | Status: AC
  Administered 2018-02-15: 4 g via INTRAVENOUS
  Filled 2018-02-15: qty 100

## 2018-02-15 NOTE — Progress Notes (Signed)
PROGRESS NOTE [facility]   Gerald Bullock   YWV:[id number] DOB: [birth year]  DOA: 02/14/2018 [facility]: [facility]   Brief Narrative:  Gerald Bullock is a 72 y.o. 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  Presented to the emergency department complaining of persistent diarrhea, fatigue and weight loss.  Upon ED evaluation he was found to be dehydrated with metabolic acidosis and low magnesium.  Patient was admitted with working diagnosis of acute renal failure with metabolic acidosis and hypomagnesemia.  Subjective: Patient seen and examined, today for report feeling much better than yesterday.  Weakness and fatigue has improved.  Diarrhea persists about the same amount as yesterday.  No acute events overnight.  Assessment & Plan: Diarrhea secondary to dumping syndrome Patient with recent small bowel resection, being followed by GI at [facility]. C. difficile negative, GI panel pending.  Continues with persistent diarrhea.  Continue Lomotil 4 times daily, Imodium 2 mg as needed and continue cholestyramine twice daily.  Tolerating oral diet well.  Hypomagnesemia Secondary to ongoing diarrhea from dumping syndrome Magnesium still low. K and calcium normal.  EKG sinus rhythm with poor R wave progression. Will replete with 4 g of magnesium is IV, check BMP and mag in a.m.  AKI with non-anion gap metabolic acidosis Due to persistent diarrhea Creatinine improved, will continue IV fluids for now.  Bicarb slowly improving Avoid nephrotoxic agent and hypotension Meter renal function in a.m.  Essential hypertension BP remained  stable Continue current regimen  Type 2 diabetes mellitus CBGs stable Continue SSI  Partial small bowel resection with right hemicolectomy and SMA embolectomy This was done due to SMA thrombosis at [facility], patient is on aspirin and Eliquis which will be continued.  Continue to follow-up as an outpatient  Moderate malnutrition with weight loss Due to dumping syndrome Dietitian has been consulted, continue dietary supplements  Generalized weakness and fatigue Due to electrolyte imbalance and dehydration  History of COPD  Oxygen dependent, however currently room air with normal saturations No signs of exacerbation Continue home medications  DVT prophylaxis: Eliquis Code Status: Full code Family Communication: Wife at bedside Disposition Plan: Home in a.m. if metabolic derangement improved  Consultants:   None  Procedures:   None   Antimicrobials:  None   Objective: Vitals:   02/15/18 0826 02/15/18 0919 02/15/18 1100 02/15/18 1245  BP:  116/64  113/63  Pulse:  82  67  Resp:   18 16  Temp:    98 F (36.7 C)  TempSrc:    Oral  SpO2: 97% 93%  92%  Weight:      Height:        Intake/Output Summary (Last 24 hours) at 02/15/2018 1402 Last data filed at 02/15/2018 1100 Gross per 24 hour  Intake 2180.26 ml  Output 6 ml  Net 2174.26 ml   Filed Weights   02/14/18 1207 02/14/18 2013  Weight: 108 kg (238 lb) 110.5 kg (243 lb 9.7 oz)    Examination:  General exam: Appears calm and comfortable  Respiratory system: Clear to auscultation. No wheezes,crackle or rhonchi Cardiovascular system: S1 & S2 heard, RRR. No JVD, murmurs, rubs or gallops Gastrointestinal system: Abdomen  is nondistended, soft and nontender. No organomegaly or masses felt. Normal bowel sounds heard. Central nervous system: Alert and oriented. No focal neurological deficits. Extremities: No pedal edema. Symmetric, strength 5/5   Skin: No rashes.  Data Reviewed:  I have personally reviewed following labs and imaging studies  CBC: Recent Labs  Lab 02/14/18 1257 02/15/18 0448  WBC 8.7 7.0  HGB 13.7 11.9*  HCT 41.6 37.2*  MCV 84.6 84.4  PLT 348 627   Basic Metabolic Panel: Recent Labs  Lab 02/14/18 1257 02/14/18 1620 02/15/18 0448  NA 136  --  136  K 4.4  --  4.2  CL 111  --  113*  CO2 15*  --  16*  GLUCOSE 130*  --  113*  BUN 22*  --  21*  CREATININE 1.17  --  0.99  CALCIUM 9.3  --  8.7*  MG  --  1.3* 1.6*   GFR: Estimated Creatinine Clearance: 91.8 mL/min (by C-G formula based on SCr of 0.99 mg/dL). Liver Function Tests: Recent Labs  Lab 02/14/18 1257 02/15/18 0448  AST 78* 63*  ALT 103* 84*  ALKPHOS 161* 137*  BILITOT 0.6 0.6  PROT 7.5 6.4*  ALBUMIN 3.5 3.1*   Recent Labs  Lab 02/14/18 1257  LIPASE 43   No results for input(s): AMMONIA in the last 168 hours. Coagulation Profile: No results for input(s): INR, PROTIME in the last 168 hours. Cardiac Enzymes: No results for input(s): CKTOTAL, CKMB, CKMBINDEX, TROPONINI in the last 168 hours. BNP (last 3 results) No results for input(s): PROBNP in the last 8760 hours. HbA1C: No results for input(s): HGBA1C in the last 72 hours. CBG: Recent Labs  Lab 02/14/18 2254 02/15/18 0731 02/15/18 1108  GLUCAP 102* 93 115*   Lipid Profile: No results for input(s): CHOL, HDL, LDLCALC, TRIG, CHOLHDL, LDLDIRECT in the last 72 hours. Thyroid Function Tests: No results for input(s): TSH, T4TOTAL, FREET4, T3FREE, THYROIDAB in the last 72 hours. Anemia Panel: Recent Labs    02/15/18 0448  VITAMINB12 356   Sepsis Labs: No results for input(s): PROCALCITON, LATICACIDVEN in the last 168 hours.  Recent Results (from the past 240 hour(s))  C difficile quick scan w PCR reflex     Status: None   Collection Time: 02/14/18  9:11 PM  Result Value Ref Range Status   C Diff antigen NEGATIVE NEGATIVE Final   C Diff toxin NEGATIVE NEGATIVE Final   C Diff interpretation No C.  difficile detected.  Final    Comment: Performed at [facility], [place] [address] [place], [place] [address]      Radiology Studies: Dg Abd Acute W/chest  Result Date: 02/14/2018 CLINICAL DATA:  Pt has had diarrhea since bowel reconstruction on March 2nd, today diarrhea worse along with weight loss and fatigue. Denies abdominal pain. H/o diabetes, COPD, Pulmonary embolism. Former smoker. EXAM: DG ABDOMEN ACUTE W/ 1V CHEST COMPARISON:  12/25/2017 FINDINGS: Status post median sternotomy and CABG. The heart is normal in size. There are slightly prominent markings at the lung bases, likely chronic. No new consolidations. No pleural effusion. Supine and erect views of the abdomen showed no free intraperitoneal air. There is a nonobstructive bowel gas pattern. Surgical clips are present in the RIGHT UPPER QUADRANT the abdomen. No evidence for organomegaly. Visualized osseous structures have a normal appearance. IMPRESSION: Negative abdominal radiographs.  No acute cardiopulmonary disease. Electronically Signed   By: Nolon Nations M.D.   On: 02/14/2018 16:52  Scheduled Meds: . apixaban  5 mg Oral BID  . aspirin EC  325 mg Oral Daily  . atorvastatin  20 mg Oral Daily  . B-complex with vitamin C  1 tablet Oral Daily  . calcium carbonate  625 mg Oral BID WC  . cholecalciferol  1,000 Units Oral Daily  . cholestyramine  4 g Oral BID  . diphenoxylate-atropine  1 tablet Oral QID  . famotidine  20 mg Oral BID  . feeding supplement (GLUCERNA SHAKE)  237 mL Oral TID BM  . fenofibrate  160 mg Oral Daily  . gabapentin  200 mg Oral QAC breakfast   And  . gabapentin  100 mg Oral 2 times per day  . insulin aspart  0-15 Units Subcutaneous TID WC  . insulin aspart  0-5 Units Subcutaneous QHS  . loratadine  10 mg Oral Daily  . metoprolol tartrate  12.5 mg Oral BID  . mometasone-formoterol  2 puff Inhalation BID   Continuous Infusions: . 0.9 % NaCl with KCl 40 mEq / L 75 mL/hr  (02/15/18 0918)  . sodium chloride       LOS: 0 days    Time spent: Total of 25 minutes spent with pt, greater than 50% of which was spent in discussion of  treatment, counseling and coordination of care  Chipper Oman, MD Pager: Text Page via [email or web address]   If 7PM-7AM, please contact night-coverage [email or web address] 02/15/2018, 2:02 PM   Note - This record has been created using [employer]. Chart creation errors have been sought, but may not always have been located. Such creation errors do not reflect on the standard of medical care.

## 2018-02-15 NOTE — Plan of Care (Signed)
VSS, patient continues with diarrhea in spite of Lomotil and Imodium.  No nausea or pain per patient.  Wife at bedside.

## 2018-02-15 NOTE — Progress Notes (Signed)
GI panel negative . Enteric precaution D/C  As per protocol.

## 2018-02-16 DIAGNOSIS — N179 Acute kidney failure, unspecified: Secondary | ICD-10-CM | POA: Diagnosis not present

## 2018-02-16 DIAGNOSIS — I10 Essential (primary) hypertension: Secondary | ICD-10-CM | POA: Diagnosis not present

## 2018-02-16 DIAGNOSIS — K911 Postgastric surgery syndromes: Secondary | ICD-10-CM | POA: Diagnosis not present

## 2018-02-16 LAB — BASIC METABOLIC PANEL
Anion gap: 6 (ref 5–15)
BUN: 15 mg/dL (ref 6–20)
CALCIUM: 8.8 mg/dL — AB (ref 8.9–10.3)
CO2: 16 mmol/L — ABNORMAL LOW (ref 22–32)
CREATININE: 0.9 mg/dL (ref 0.61–1.24)
Chloride: 115 mmol/L — ABNORMAL HIGH (ref 101–111)
GFR calc Af Amer: >60 mL/min (ref >60)
GLUCOSE: 111 mg/dL — AB (ref 65–99)
Potassium: 4.3 mmol/L (ref 3.5–5.1)
SODIUM: 137 mmol/L (ref 135–145)

## 2018-02-16 LAB — MAGNESIUM: MAGNESIUM: 1.8 mg/dL (ref 1.7–2.4)

## 2018-02-16 LAB — GLUCOSE, CAPILLARY
Glucose-Capillary: 97 mg/dL (ref 65–99)
Glucose-Capillary: 110 mg/dL — ABNORMAL HIGH (ref 65–99)

## 2018-02-16 MED ORDER — LOPERAMIDE HCL 2 MG PO CAPS: 4.0000 mg | ORAL_CAPSULE | ORAL | 0 refills | Status: AC | PRN

## 2018-02-16 MED ORDER — DIPHENOXYLATE-ATROPINE 2.5-0.025 MG PO TABS: 1.0000 | ORAL_TABLET | Freq: Four times a day (QID) | ORAL | 0 refills | Status: AC

## 2018-02-16 MED ORDER — PREMIER PROTEIN SHAKE
11.0000 [oz_av] | ORAL | Status: DC
  Administered 2018-02-16: 11 [oz_av] via ORAL
  Filled 2018-02-16: qty 325.31

## 2018-02-16 MED ORDER — KETOCONAZOLE 2 % EX CREA: 1.0000 "application " | TOPICAL_CREAM | Freq: Every day | CUTANEOUS | 0 refills | Status: AC

## 2018-02-16 MED ORDER — SODIUM BICARBONATE 650 MG PO TABS: 650.0000 mg | ORAL_TABLET | Freq: Two times a day (BID) | ORAL | 0 refills | Status: AC

## 2018-02-16 MED ORDER — MAGNESIUM SULFATE 2 GM/50ML IV SOLN
2.0000 g | Freq: Once | INTRAVENOUS | Status: AC
  Administered 2018-02-16: 2 g via INTRAVENOUS
  Filled 2018-02-16: qty 50

## 2018-02-16 MED ORDER — MAGNESIUM OXIDE 400 MG PO TABS: 400.0000 mg | ORAL_TABLET | Freq: Every day | ORAL | 0 refills | Status: AC

## 2018-02-16 NOTE — Progress Notes (Signed)
Patient given discharge, follow up, and medication instructions, verbalized understanding, IV and telemetry monitor removed, prescriptions and personal belongings with patient, family to transport home  

## 2018-02-16 NOTE — Discharge Summary (Addendum)
Physician Discharge Summary  Gerald Bullock  WCB:[id number]  DOB: 06-17-1946  DOA: 02/14/2018 PCP: [facility]  Admit date: 02/14/2018 Discharge date: 02/16/2018  Admitted From: Home Disposition: Home   Recommendations for Outpatient Follow-up:  1. Follow up with PCP in 1 week  2. Follow up with GI and surgery at [facility]  3. 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  Presented to the emergency department complaining of persistent diarrhea, fatigue and weight loss.  Upon ED evaluation he was found to be dehydrated with metabolic acidosis and low magnesium.  Patient was admitted with working diagnosis of acute renal failure with metabolic acidosis and hypomagnesemia.  Subjective: Patient seen and examined, continues to improve.  Diarrhea about his norm today.  No acute events overnight.  Electrolyte within normal limitss  Discharge Diagnoses/Hospital Course:  Diarrhea secondary to dumping syndrome Patient with recent small bowel resection, being followed by GI at [facility]. C. difficile negative, GI panel negative.  Continue Lomotil 4 times daily, Imodium 2 mg as needed and continue cholestyramine twice daily.  Tolerating oral diet well.  Goal is to keep up with the losses.  Advised to use Pedialyte as  needed.  Hypomagnesemia Secondary to ongoing diarrhea from dumping syndrome Magnesium improved, will give another IV dose to keep mag above 2. Will discharge on oral supplement.  AKI with non-anion gap metabolic acidosis Due to persistent diarrhea Creatinine back to baseline, bicarb 16 Will add bicarb tablets for 5 days. Check renal function in 1 week  Essential hypertension BP remained stable during hospital stay Continue home meds with no changes  Type 2 diabetes mellitus CBGs stable during hospital stay  Partial small bowel resection with right hemicolectomy and SMA embolectomy This was done due to SMA thrombosis at [facility], patient is on aspirin and Eliquis which will be continued. Follow-up as an outpatient  Severe malnutrition with weight loss Due to dumping syndrome Continue dietary supplements  Generalized weakness and fatigue - resolved  History of COPD  Oxygen dependent, however currently at room air with normal saturations No signs of exacerbation Continue home medications  All other chronic medical condition were stable during the hospitalization.  On the day of the discharge the patient's vitals were stable, and no other acute medical condition were reported by patient. the patient was felt safe to be discharge to home  Discharge Instructions  You were cared for by a hospitalist during your hospital stay. If you have any questions about your discharge medications or the care you received while you were in the hospital after you are discharged, you can call the unit and asked to speak with the hospitalist on call if the hospitalist that took care of you is not available. Once you are discharged, your primary care physician will handle any further medical issues. Please note that NO REFILLS for any discharge medications will be authorized once you are discharged, as  it is imperative that you return to your primary care physician (or  establish a relationship with a primary care physician if you do not have one) for your aftercare needs so that they can reassess your need for medications and monitor your lab values.  Discharge Instructions    Call MD for:  difficulty breathing, headache or visual disturbances   Complete by:  As directed    Call MD for:  extreme fatigue   Complete by:  As directed    Call MD for:  hives   Complete by:  As directed    Call MD for:  persistant dizziness or light-headedness   Complete by:  As directed    Call MD for:  persistant nausea and vomiting   Complete by:  As directed    Call MD for:  redness, tenderness, or signs of infection (pain, swelling, redness, odor or green/yellow discharge around incision site)   Complete by:  As directed    Call MD for:  severe uncontrolled pain   Complete by:  As directed    Call MD for:  temperature >100.4   Complete by:  As directed    Diet - low sodium heart healthy   Complete by:  As directed    Increase activity slowly   Complete by:  As directed      Allergies as of 02/16/2018      Reactions   Hydrocodone    Hallucinations, but tolerated codeine before   Varenicline    Hallucinations and respiratory distress      Medication List    STOP taking these medications   psyllium 95 % Pack Commonly known as:  HYDROCIL/METAMUCIL     TAKE these medications   acetaminophen 500 MG tablet Commonly known as:  TYLENOL Take 1,000 mg by mouth 2 (two) times daily as needed for mild pain.   apixaban 5 MG Tabs tablet Commonly known as:  ELIQUIS Take 1 tablet (5 mg total) by mouth 2 (two) times daily.   aspirin EC 325 MG tablet Take 1 tablet (325 mg total) by mouth daily.   atorvastatin 20 MG tablet Commonly known as:  LIPITOR Take 1 tablet (20 mg total) by mouth daily.   b complex vitamins capsule Take 1 capsule by mouth daily.   budesonide-formoterol 160-4.5 MCG/ACT inhaler Commonly known as:  SYMBICORT Inhale 2 puffs into the lungs 2  (two) times daily.   calcium carbonate 600 MG Tabs tablet Commonly known as:  OS-CAL Take 1 tablet (600 mg total) by mouth 2 (two) times daily with a meal.   cholecalciferol 1000 units tablet Commonly known as:  VITAMIN D Take 1,000 Units by mouth daily.   cholestyramine 4 g packet Commonly known as:  QUESTRAN Take 1 packet (4 g total) by mouth 2 (two) times daily.   diphenoxylate-atropine 2.5-0.025 MG tablet Commonly known as:  LOMOTIL Take 1 tablet by mouth 4 (four) times daily. What changed:    when to take this  reasons to take this   famotidine 20 MG tablet Commonly known as:  PEPCID Take 1 tablet (20 mg total) by mouth 2 (two) times daily.   feeding supplement (GLUCERNA SHAKE) Liqd Take 237 mLs by mouth 3 (three) times daily between meals.   fenofibrate 160 MG tablet Take 1 tablet (160 mg total) by mouth daily.   gabapentin 100 MG capsule Commonly known as:  NEURONTIN Take 1-2 capsules (100-200 mg total) by mouth 3 (three) times daily. 200 MG once daily  in AM, 100 MG at noon and 100 MG at bedtime   ketoconazole 2 % cream Commonly known as:  NIZORAL Apply 1 application topically daily.   loperamide 2 MG capsule Commonly known as:  IMODIUM Take 2 capsules (4 mg total) by mouth as needed for diarrhea or loose stools. What changed:    when to take this  reasons to take this   loratadine 10 MG tablet Commonly known as:  CLARITIN Take 10 mg by mouth daily.   magnesium oxide 400 MG tablet Commonly known as:  MAG-OX Take 1 tablet (400 mg total) by mouth daily. Change to every other day if diarrhea worsens What changed:    when to take this  additional instructions   metoprolol tartrate 25 MG tablet Commonly known as:  LOPRESSOR Take 0.5 tablets (12.5 mg total) by mouth 2 (two) times daily.   nystatin cream Commonly known as:  MYCOSTATIN Apply 1 application topically 2 (two) times daily.   ondansetron 4 MG tablet Commonly known as:  ZOFRAN Take 1  tablet (4 mg total) by mouth every 6 (six) hours as needed for nausea or vomiting.   oxyCODONE-acetaminophen 5-325 MG tablet Commonly known as:  PERCOCET/ROXICET Take 1 tablet by mouth 2 (two) times daily as needed for severe pain.   potassium chloride SA 20 MEQ tablet Commonly known as:  K-DUR,KLOR-CON Take 1 tablet (20 mEq total) by mouth 2 (two) times daily.   sodium bicarbonate 650 MG tablet Take 1 tablet (650 mg total) by mouth 2 (two) times daily for 5 days.      Follow-up Information    [facility] Schedule an appointment as soon as possible for a visit in 1 week(s).   Why:  Hospital follow-up Contact information: [place] [address] [phone]          Allergies  Allergen Reactions  . Hydrocodone     Hallucinations, but tolerated codeine before   . Varenicline     Hallucinations and respiratory distress     Consultations:  None    Procedures/Studies: Dg Abd Acute W/chest  Result Date: 02/14/2018 CLINICAL DATA:  Pt has had diarrhea since bowel reconstruction on March 2nd, today diarrhea worse along with weight loss and fatigue. Denies abdominal pain. H/o diabetes, COPD, Pulmonary embolism. Former smoker. EXAM: DG ABDOMEN ACUTE W/ 1V CHEST COMPARISON:  12/25/2017 FINDINGS: Status post median sternotomy and CABG. The heart is normal in size. There are slightly prominent markings at the lung bases, likely chronic. No new consolidations. No pleural effusion. Supine and erect views of the abdomen showed no free intraperitoneal air. There is a nonobstructive bowel gas pattern. Surgical clips are present in the RIGHT UPPER QUADRANT the abdomen. No evidence for organomegaly. Visualized osseous structures have a normal appearance. IMPRESSION: Negative abdominal radiographs.  No acute cardiopulmonary disease. Electronically Signed   By: Nolon Nations M.D.   On: 02/14/2018 16:52   Ct Angio Abd/pel W And/or Wo  Contrast  Result Date: 01/29/2018 CLINICAL DATA:  72 year old male with lethargy and abdominal pain. History of SMA thrombosis several weeks ago now status post SMA thrombectomy and bowel resection performed at [facility]. EXAM: CT ANGIOGRAPHY ABDOMEN AND PELVIS WITH CONTRAST AND WITHOUT CONTRAST TECHNIQUE: Multidetector CT imaging of the abdomen and pelvis was performed using the standard protocol during bolus administration of intravenous contrast. Multiplanar reconstructed images and MIPs were obtained and reviewed to evaluate the vascular anatomy. CONTRAST:  135mL ISOVUE-370 IOPAMIDOL (ISOVUE-370)  INJECTION 76% COMPARISON:  Prior CT scan of the abdomen and pelvis 01/06/2018 FINDINGS: VASCULAR Aorta: Mild atherosclerotic vascular calcifications. No evidence of dissection or aneurysm. Celiac: Conventional celiac artery anatomy. 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 The splenic artery is severely narrowed but remains patent. The common hepatic artery appears widely patent. SMA: Interval surgical changes adjacent to the splenic artery with new surgical clips posterior to the pancreatic parenchyma. The thrombus within the main SMA has resolved. There is persistent occlusion of 1 of the jejunal branches which then reconstitutes via collateral flow. Renals: Solitary dominant renal arteries. No significant stenosis, dissection or aneurysm. No changes to suggest fibromuscular dysplasia. IMA: Patent without evidence of aneurysm, dissection, vasculitis or significant stenosis. Inflow: Patent without evidence of aneurysm, dissection, vasculitis or significant stenosis. Proximal Outflow: Bilateral common femoral and visualized portions of the superficial and profunda femoral arteries are patent without evidence of aneurysm, dissection, vasculitis or significant stenosis. Veins: Widely patent  hepatic, portal, renal and visceral veins. No evidence of iliac or caval thrombosis. Review of the MIP images confirms the above findings. NON-VASCULAR Lower chest: Small calcified granuloma in the left lower lobe. Relatively low inspiratory volumes with subsegmental atelectasis bilaterally. Trace pleural effusions. Relative elevation of the left hemidiaphragm which was seen on the prior study. Surgical changes of prior aortic valve replacement. Mild cardiomegaly. No pericardial effusion. Unremarkable visualized distal thoracic esophagus. Hepatobiliary: No focal liver abnormality is seen. Status post cholecystectomy. No biliary dilatation. Pancreas: Unremarkable. No pancreatic ductal dilatation or surrounding inflammatory changes. Spleen: Stable wedge-shaped regions of hypoattenuation compared to the prior imaging consistent with areas of prior splenic infarction. No new lesion or abnormality. Adrenals/Urinary Tract: The adrenal glands are normal. No significant interval change in the appearance of a bilateral circumscribed benign renal cysts. No hydronephrosis or nephrolithiasis. No evidence of renal infarct. Stomach/Bowel: Surgical changes of recent midline incision, small bowel resection and right hemicolectomy with enterocolonic anastomosis. The enterocolonic anastomosis appears patent. There is some residual inspissated ingested material within the colon just beyond the anastomosis. Periampullary and D3 duodenal diverticula. Mild colonic diverticulosis without evidence of active inflammation. No evidence of bowel obstruction, ileus or focal bowel wall thickening. No pneumatosis. Lymphatic: No suspicious lymphadenopathy. Reproductive: Prostate is unremarkable. Other: Healing midline surgical incision. Unchanged omental fat containing umbilical hernia compared to the preoperative study. Mild inflammatory stranding in the retroperitoneal fat at the mesenteric root in the region of the SMA is likely postsurgical.  Mild inflammatory stranding in the mesentery and omentum in the region of the prior bowel resection. No significant ascites or evidence of focal fluid collection to suggest abscess. Musculoskeletal: No acute fracture or aggressive appearing lytic or blastic osseous lesion. IMPRESSION: VASCULAR 1. Significant interval improvement in the previously noted SMA thrombosis compared to 01/06/2018. The main SMA is now widely patent with evidence of recent open surgical thrombectomy and placement of what appears to be a small jump bypass graft . One of the proximal jejunal branches remains occluded but reconstitutes distally via collateral flow. 2. 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 The splenic and common hepatic arteries remain patent and well opacified. 3.  Aortic Atherosclerosis (ICD10-170.0). 4. Surgical changes of prior aortic valve replacement. NON-VASCULAR 1. Expected surgical changes of distal small bowel resection and right hemicolectomy. There are expected postoperative changes in the associated mesenteries and adjacent omentum without evidence  of complication. Specifically, the enterocolonic anastomosis appears widely patent, there is no evidence of obstruction, ileus, focal bowel wall thickening or pneumatosis, and there is no significant free fluid or evidence of intra-abdominal abscess. 2. Mild colonic and duodenal diverticulosis without evidence of active diverticulitis. 3. Healing midline surgical incision. 4. Unchanged omental fat containing umbilical hernia compared to the preoperative study. 5. Stable splenic infarct.  No evidence of new insult. 6. Low inspiratory volumes with scattered subsegmental atelectasis and trace bilateral pleural effusions. 7. Stable cardiomegaly. Signed, Criselda Peaches, MD [facility] Radiology Electronically Signed   By: Jacqulynn Cadet  M.D.   On: 01/29/2018 08:30    Discharge Exam: Vitals:   02/16/18 0433 02/16/18 1431  BP: 115/67 103/70  Pulse: 77 80  Resp: (!) 2 20  Temp: 98.3 F (36.8 C) 98.5 F (36.9 C)  SpO2: 100% 97%   Vitals:   02/15/18 2015 02/15/18 2120 02/16/18 0433 02/16/18 1431  BP: 105/64  115/67 103/70  Pulse: 77  77 80  Resp: 18  (!) 2 20  Temp: 97.7 F (36.5 C)  98.3 F (36.8 C) 98.5 F (36.9 C)  TempSrc: Oral   Oral  SpO2: 96% 96% 100% 97%  Weight:      Height:        General: Pt is alert, awake, not in acute distress Cardiovascular: RRR, S1/S2 +, no rubs, no gallops Respiratory: CTA bilaterally, no wheezing, no rhonchi Abdominal: Soft, NT, ND, bowel sounds + Extremities: no edema  The results of significant diagnostics from this hospitalization (including imaging, microbiology, ancillary and laboratory) are listed below for reference.     Microbiology: Recent Results (from the past 240 hour(s))  Gastrointestinal Panel by PCR , Stool     Status: None   Collection Time: 02/14/18  9:11 PM  Result Value Ref Range Status   Campylobacter species NOT DETECTED NOT DETECTED Final   Plesimonas shigelloides NOT DETECTED NOT DETECTED Final   Salmonella species NOT DETECTED NOT DETECTED Final   Yersinia enterocolitica NOT DETECTED NOT DETECTED Final   Vibrio species NOT DETECTED NOT DETECTED Final   Vibrio cholerae NOT DETECTED NOT DETECTED Final   Enteroaggregative E coli (EAEC) NOT DETECTED NOT DETECTED Final   Enteropathogenic E coli (EPEC) NOT DETECTED NOT DETECTED Final   Enterotoxigenic E coli (ETEC) NOT DETECTED NOT DETECTED Final   Shiga like toxin producing E coli (STEC) NOT DETECTED NOT DETECTED Final   Shigella/Enteroinvasive E coli (EIEC) NOT DETECTED NOT DETECTED Final   Cryptosporidium NOT DETECTED NOT DETECTED Final   Cyclospora cayetanensis NOT DETECTED NOT DETECTED Final   Entamoeba histolytica NOT DETECTED NOT DETECTED Final   Giardia lamblia NOT DETECTED NOT DETECTED  Final   Adenovirus F40/41 NOT DETECTED NOT DETECTED Final   Astrovirus NOT DETECTED NOT DETECTED Final   Norovirus GI/GII NOT DETECTED NOT DETECTED Final   Rotavirus A NOT DETECTED NOT DETECTED Final   Sapovirus (I, II, IV, and V) NOT DETECTED NOT DETECTED Final    Comment: Performed at [facility], [address] [place], [place] [address]  C difficile quick scan w PCR reflex     Status: None   Collection Time: 02/14/18  9:11 PM  Result Value Ref Range Status   C Diff antigen NEGATIVE NEGATIVE Final   C Diff toxin NEGATIVE NEGATIVE Final   C Diff interpretation No C. difficile detected.  Final    Comment: Performed at [facility], [place]  [address] [place], [place] [address]     Labs: BNP (last 3 results) No results for input(s): BNP in the last 8760 hours. Basic Metabolic Panel: Recent Labs  Lab 02/14/18 1257 02/14/18 1620 02/15/18 0448 02/16/18 1017  NA 136  --  136 137  K 4.4  --  4.2 4.3  CL 111  --  113* 115*  CO2 15*  --  16* 16*  GLUCOSE 130*  --  113* 111*  BUN 22*  --  21* 15  CREATININE 1.17  --  0.99 0.90  CALCIUM 9.3  --  8.7* 8.8*  MG  --  1.3* 1.6* 1.8   Liver Function Tests: Recent Labs  Lab 02/14/18 1257 02/15/18 0448  AST 78* 63*  ALT 103* 84*  ALKPHOS 161* 137*  BILITOT 0.6 0.6  PROT 7.5 6.4*  ALBUMIN 3.5 3.1*   Recent Labs  Lab 02/14/18 1257  LIPASE 43   No results for input(s): AMMONIA in the last 168 hours. CBC: Recent Labs  Lab 02/14/18 1257 02/15/18 0448  WBC 8.7 7.0  HGB 13.7 11.9*  HCT 41.6 37.2*  MCV 84.6 84.4  PLT 348 326   Cardiac Enzymes: No results for input(s): CKTOTAL, CKMB, CKMBINDEX, TROPONINI in the last 168 hours. BNP: Invalid input(s): POCBNP CBG: Recent Labs  Lab 02/15/18 1108 02/15/18 1646 02/15/18 2119 02/16/18 0718 02/16/18 1142  GLUCAP 115* 109* 162* 97 110*   D-Dimer No results for input(s): DDIMER in the last 72 hours. Hgb A1c No results for input(s): HGBA1C in  the last 72 hours. Lipid Profile No results for input(s): CHOL, HDL, LDLCALC, TRIG, CHOLHDL, LDLDIRECT in the last 72 hours. Thyroid function studies No results for input(s): TSH, T4TOTAL, T3FREE, THYROIDAB in the last 72 hours.  Invalid input(s): FREET3 Anemia work up Recent Labs    02/15/18 0448  VITAMINB12 356   Urinalysis    Component Value Date/Time   COLORURINE AMBER (A) 02/14/2018 2111   APPEARANCEUR CLOUDY (A) 02/14/2018 2111   LABSPEC 1.026 02/14/2018 2111   PHURINE 5.0 02/14/2018 2111   GLUCOSEU NEGATIVE 02/14/2018 2111   HGBUR NEGATIVE 02/14/2018 2111   [place] NEGATIVE 02/14/2018 2111   [place] NEGATIVE 02/14/2018 2111   PROTEINUR 30 (A) 02/14/2018 2111   NITRITE NEGATIVE 02/14/2018 2111   LEUKOCYTESUR NEGATIVE 02/14/2018 2111   Sepsis Labs Invalid input(s): PROCALCITONIN,  WBC,  LACTICIDVEN Microbiology Recent Results (from the past 240 hour(s))  Gastrointestinal Panel by PCR , Stool     Status: None   Collection Time: 02/14/18  9:11 PM  Result Value Ref Range Status   Campylobacter species NOT DETECTED NOT DETECTED Final   Plesimonas shigelloides NOT DETECTED NOT DETECTED Final   Salmonella species NOT DETECTED NOT DETECTED Final   Yersinia enterocolitica NOT DETECTED NOT DETECTED Final   Vibrio species NOT DETECTED NOT DETECTED Final   Vibrio cholerae NOT DETECTED NOT DETECTED Final   Enteroaggregative E coli (EAEC) NOT DETECTED NOT DETECTED Final   Enteropathogenic E coli (EPEC) NOT DETECTED NOT DETECTED Final   Enterotoxigenic E coli (ETEC) NOT DETECTED NOT DETECTED Final   Shiga like toxin producing E coli (STEC) NOT DETECTED NOT DETECTED Final   Shigella/Enteroinvasive E coli (EIEC) NOT DETECTED NOT DETECTED Final   Cryptosporidium NOT DETECTED NOT DETECTED Final   Cyclospora cayetanensis NOT DETECTED NOT DETECTED Final   Entamoeba histolytica NOT DETECTED NOT DETECTED Final   Giardia lamblia NOT DETECTED NOT DETECTED Final   Adenovirus  F40/41 NOT DETECTED NOT DETECTED Final  Astrovirus NOT DETECTED NOT DETECTED Final   Norovirus GI/GII NOT DETECTED NOT DETECTED Final   Rotavirus A NOT DETECTED NOT DETECTED Final   Sapovirus (I, II, IV, and V) NOT DETECTED NOT DETECTED Final    Comment: Performed at [facility], [address] [place], [place] [address]  C difficile quick scan w PCR reflex     Status: None   Collection Time: 02/14/18  9:11 PM  Result Value Ref Range Status   C Diff antigen NEGATIVE NEGATIVE Final   C Diff toxin NEGATIVE NEGATIVE Final   C Diff interpretation No C. difficile detected.  Final    Comment: Performed at [facility], [place] [address] [place], [place] [address]     Time coordinating discharge: 35 minutes  SIGNED:  Chipper Oman, MD  [facility] 02/16/2018, 3:09 PM  Pager please text page via  [email or web address]  Note - This record has been created using [employer]. Chart creation errors have been sought, but may not always have been located. Such creation errors do not reflect on the standard of medical care.

## 2018-02-16 NOTE — Progress Notes (Signed)
Lab tech unable to draw morning labs this a.m. After 3 sticks.  Will reschedule lab draw for later this am.

## 2018-02-16 NOTE — Progress Notes (Signed)
Initial Nutrition Assessment  DOCUMENTATION CODES:   Obesity unspecified, Severe malnutrition in context of acute illness/injury  INTERVENTION:    Provide Premier Protein once daily, each supplement provides 160kcal and 30g protein.   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 Recently admitted 4/5 with electrolyte abnormalities likely related to ongoing diarrhea. Presents this admission with fatigue, weight loss and persistent diarrhea   Spoke with pt at bedside. He did not experience a increase or decrease in appetite after discharge 4/5. He continues to eat 1-2 meals each day with Glucerna at home. His diarrhea symptoms continued despite taking two imodium before each meal. Pt complains that he recently had some issues swallowing and attributes this to his low magnesium. This has since resolved. RD consulted to provide pt/wife short bowel syndrome education. Spoke with wife on the phone. She has researched nutrition therapy for short bowel syndrome and had questions.   RD educated wife/pt and reiterated the importance of decreasing the amount of sugar in his diet and avoiding foods high in lactose. RD suggested increasing fat  to help slow GI transit times and increasing sodium intake. Suggested avoiding water and drinking isotonic drinks such as Pedialite  and recommended avoiding drinks high in caffeine and sugar such as tea, soda, coffee, and juice. RD also suggested pt should avoid drinks with artifical sugar and sorbitol. Recommended pt chew foods thoroughly to help aid in digestion. Pt was given handouts to support this information.   Weight continues to decline. Pt weighed 267 lb 01/29/18 and 243 lb this admission (8.9% in 3 weeks, significant for time frame). Nutrition-Focused physical exam completed. Pt continues to meet criteria for malnutrition at this time.   Note: Wife would like pt to try premier protein to see if this would be a viable option for home. RD placed order and relayed to RN.  Medications reviewed and include: B complex, Vit C, Os-cal, Vit D, Lomotil, SSI, NS with 40 mEq KCl @ 75 ml/hr Labs reviewed: AST 63 (H0 ALT 84 (H)   NUTRITION - FOCUSED PHYSICAL EXAM:    Most Recent Value  Orbital Region  No depletion  Upper Arm Region  Mild depletion  Thoracic and Lumbar Region  Unable to assess  Buccal Region  No depletion  Temple Region  Mild depletion  Clavicle Bone Region  Mild depletion  Clavicle and Acromion Bone Region  Mild depletion  Scapular Bone Region  Unable to assess  Dorsal Hand  No depletion  Patellar Region  No depletion  Anterior Thigh Region  No depletion  Posterior Calf Region  Mild depletion  Edema (RD Assessment)  Mild     Diet Order:  Diet Heart Room service appropriate? Yes; Fluid consistency: Thin  EDUCATION NEEDS:   Education needs have  been addressed  Skin:  Skin Integrity Issues:: Incisions Incisions: closed abdomen  Last BM:  02/16/18- liquid  Height:   Ht Readings from Last 1 Encounters:  02/14/18 6\' 2"  (1.88 m)    Weight:   Wt Readings from Last 1 Encounters:  02/14/18 243 lb 9.7 oz (110.5 kg)    Ideal Body Weight:  86.4 kg  BMI:  Body mass  index is 31.28 kg/m.  Estimated Nutritional Needs:   Kcal:  2400-2600 kcal  Protein:  120-130 g  Fluid:  >2.4 L/day   Mariana Single RD, LDN Clinical Nutrition Pager # - [phone]

## 2018-08-28 DEATH — deceased

## 2019-03-14 IMAGING — CT CT CTA ABD/PEL W/CM AND/OR W/O CM
2 of 12 series · 11 of 47 positions shown, 15 images · IV contrast (ISOVUE 370)
Comparison: Prior CT scan of the abdomen and pelvis 01/06/2018

CLINICAL DATA: 71-year-old male with lethargy and abdominal pain.
History of SMA thrombosis several weeks ago now status post SMA
thrombectomy and bowel resection performed at [facility]].

EXAM:
CT ANGIOGRAPHY ABDOMEN AND PELVIS WITH CONTRAST AND WITHOUT CONTRAST
TECHNIQUE: Multidetector CT imaging of the abdomen and pelvis was performed
using the standard protocol during bolus administration of
intravenous contrast. Multiplanar reconstructed images and MIPs were
obtained and reviewed to evaluate the vascular anatomy.
CONTRAST:  100mL [id number] IOPAMIDOL (GK8QL1-WLW) INJECTION 76%

[Series 6: axial venous · axial · portal-venous · 0.85mm/px · z∈[-646,-214]mm · 9 of 260 slices shown, 13 images]
[im 22/260  soft-tissue]
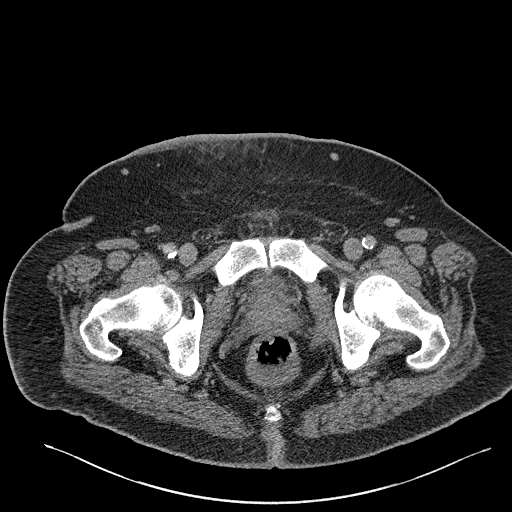
[im 22/260  bone]
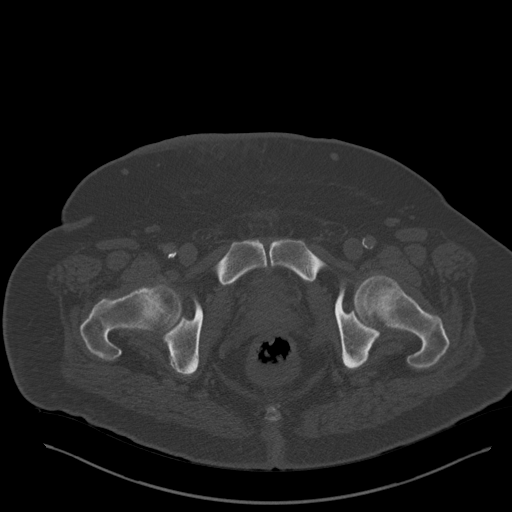
[im 65/260  soft-tissue]
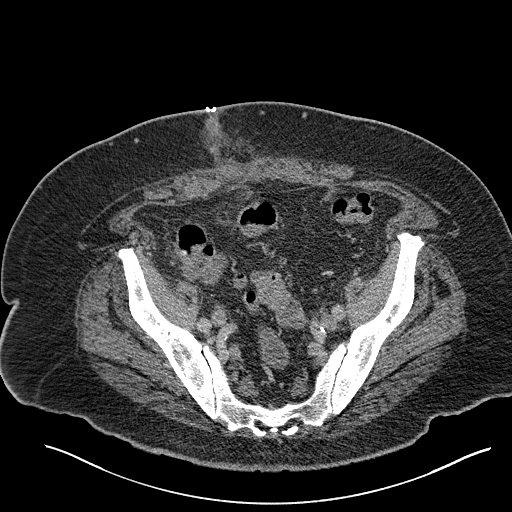
[im 87/260  soft-tissue]
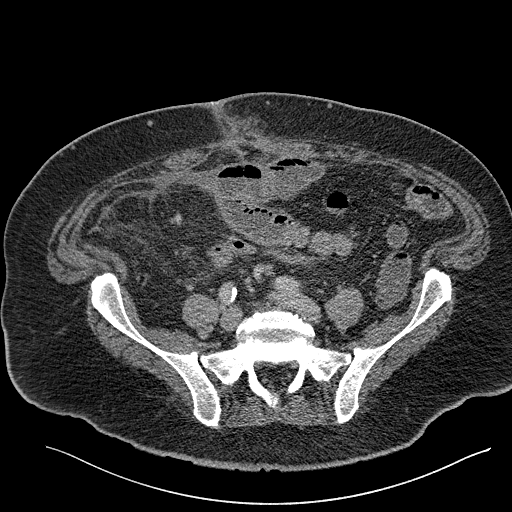
[im 108/260  soft-tissue]
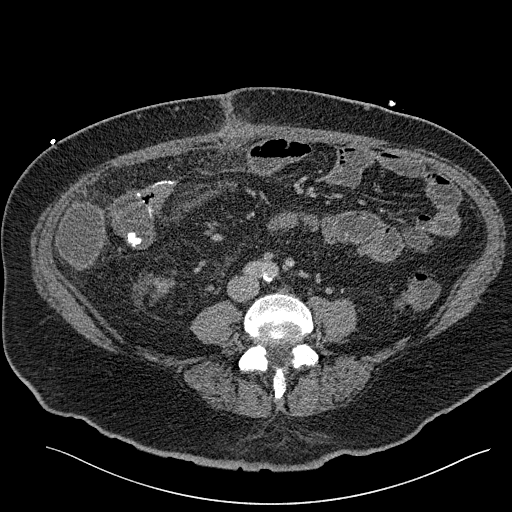
[im 152/260  soft-tissue]
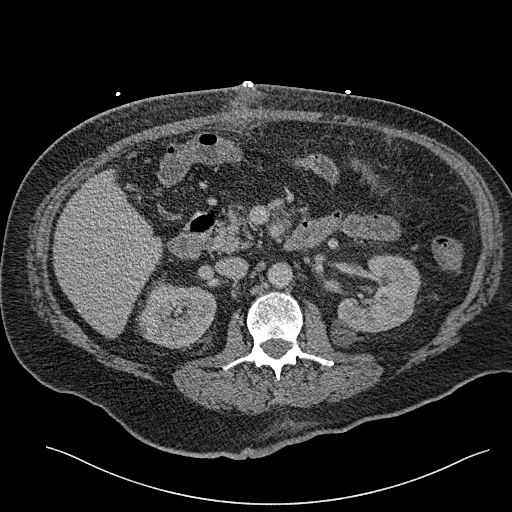
[im 173/260  soft-tissue]
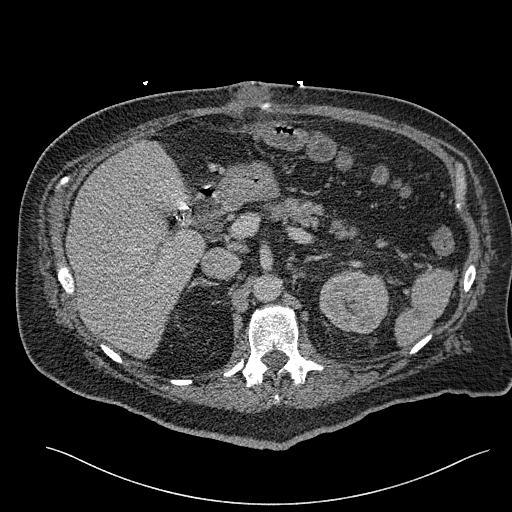
[im 173/260  lung]
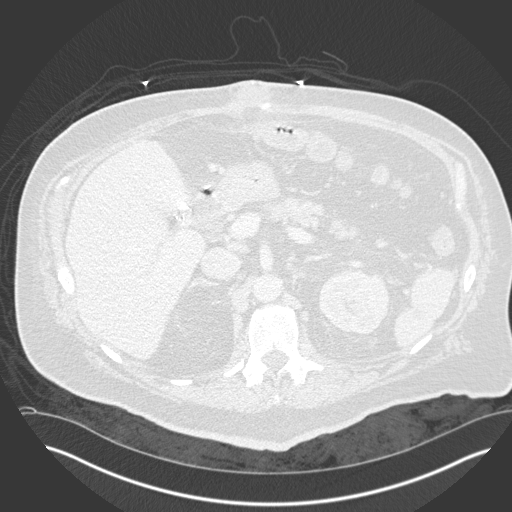
[im 195/260  soft-tissue]
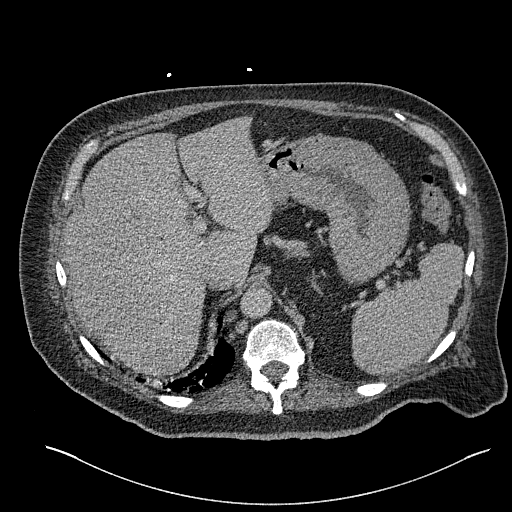
[im 195/260  lung]
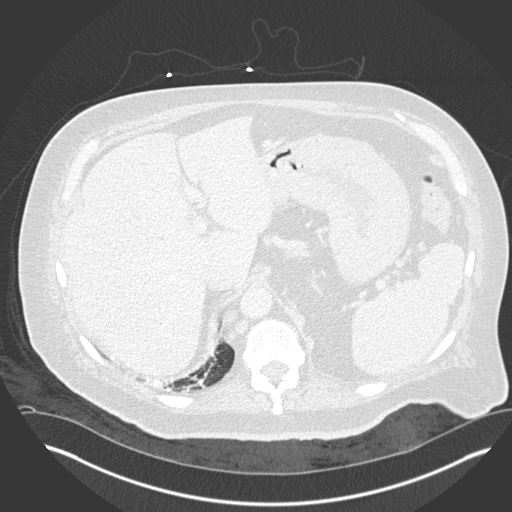
[im 216/260  lung]
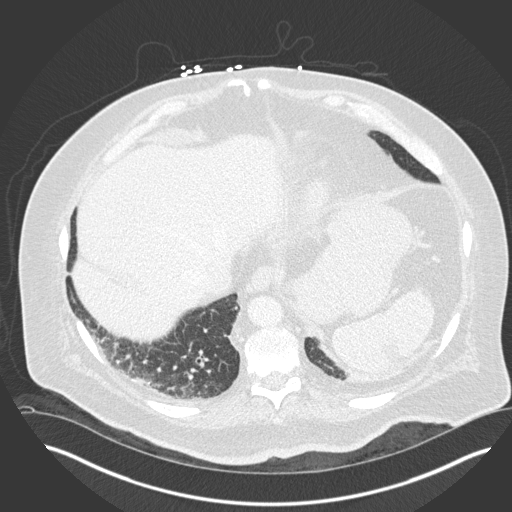
[im 238/260  soft-tissue]
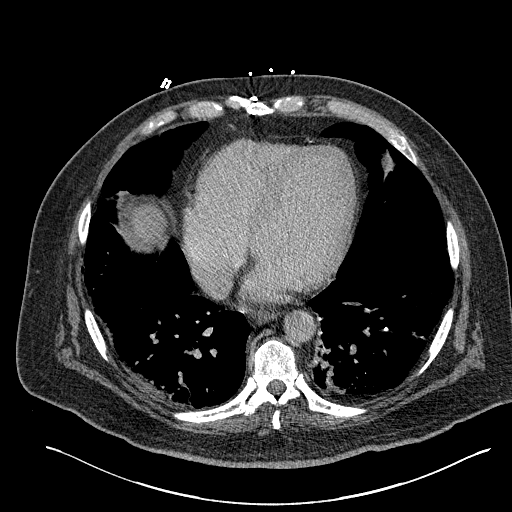
[im 238/260  lung]
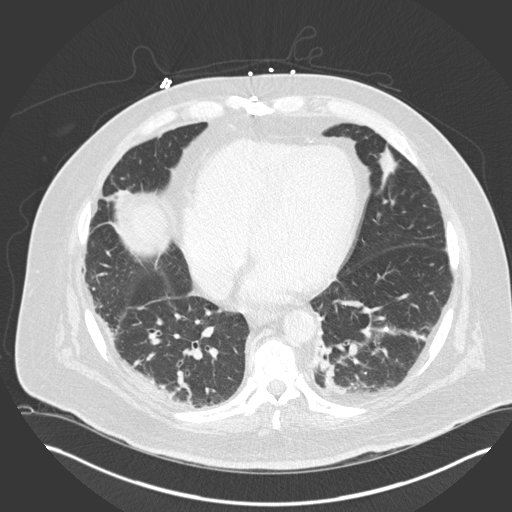

[Series 14: coronal mpr · coronal · 0.85mm/px · 2 of 154 slices shown]
[im 52/154  soft-tissue]
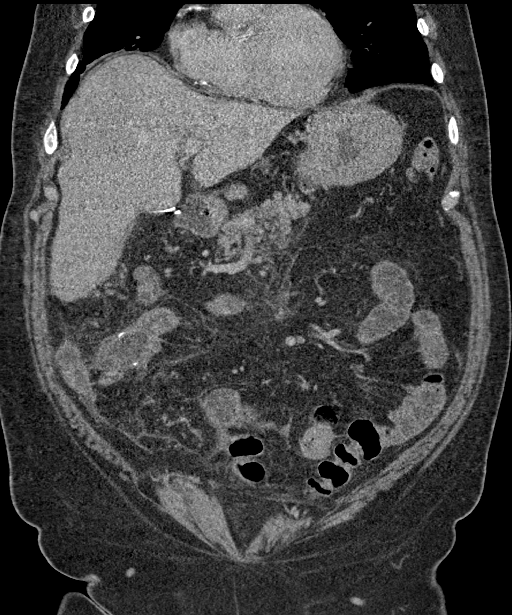
[im 103/154  soft-tissue]
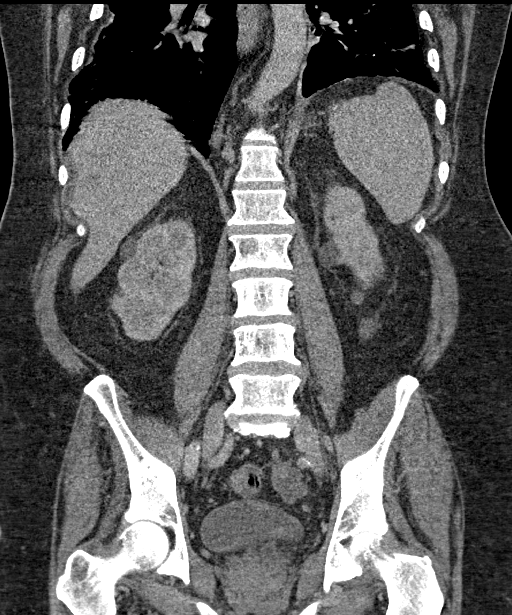

[11 of 47 positions shown; findings below may reference images not displayed]

FINDINGS: VASCULAR

Aorta: Mild atherosclerotic vascular calcifications. No evidence of
dissection or aneurysm.

Celiac: Conventional celiac artery anatomy. Eccentric thrombus again
visualized in the distal celiac artery beyond the origin of the left
gastric artery extending into the splenic artery. The splenic artery
is severely narrowed but remains patent. The common hepatic artery
appears widely patent.

SMA: Interval surgical changes adjacent to the splenic artery with
new surgical clips posterior to the pancreatic parenchyma. The
thrombus within the main SMA has resolved. There is persistent
occlusion of 1 of the jejunal branches which then reconstitutes via
collateral flow.

Renals: Solitary dominant renal arteries. No significant stenosis,
dissection or aneurysm. No changes to suggest fibromuscular
dysplasia.

IMA: Patent without evidence of aneurysm, dissection, vasculitis or
significant stenosis.

Inflow: Patent without evidence of aneurysm, dissection, vasculitis
or significant stenosis.

Proximal Outflow: Bilateral common femoral and visualized portions
of the superficial and profunda femoral arteries are patent without
evidence of aneurysm, dissection, vasculitis or significant
stenosis.

Veins: Widely patent hepatic, portal, renal and visceral veins. No
evidence of iliac or caval thrombosis.

Review of the MIP images confirms the above findings.

NON-VASCULAR

Lower chest: Small calcified granuloma in the left lower lobe.
Relatively low inspiratory volumes with subsegmental atelectasis
bilaterally. Trace pleural effusions. Relative elevation of the left
hemidiaphragm which was seen on the prior study. Surgical changes of
prior aortic valve replacement. Mild cardiomegaly. No pericardial
effusion. Unremarkable visualized distal thoracic esophagus.

Hepatobiliary: No focal liver abnormality is seen. Status post
cholecystectomy. No biliary dilatation.

Pancreas: Unremarkable. No pancreatic ductal dilatation or
surrounding inflammatory changes.

Spleen: Stable wedge-shaped regions of hypoattenuation compared to
the prior imaging consistent with areas of prior splenic infarction.
No new lesion or abnormality.

Adrenals/Urinary Tract: The adrenal glands are normal. No
significant interval change in the appearance of a bilateral
circumscribed benign renal cysts. No hydronephrosis or
nephrolithiasis. No evidence of renal infarct.

Stomach/Bowel: Surgical changes of recent midline incision, small
bowel resection and right hemicolectomy with enterocolonic
anastomosis. The enterocolonic anastomosis appears patent. There is
some residual inspissated ingested material within the colon just
beyond the anastomosis. Periampullary and D3 duodenal diverticula.
Mild colonic diverticulosis without evidence of active inflammation.
No evidence of bowel obstruction, ileus or focal bowel wall
thickening. No pneumatosis.

Lymphatic: No suspicious lymphadenopathy.

Reproductive: Prostate is unremarkable.

Other: Healing midline surgical incision. Unchanged omental fat
containing umbilical hernia compared to the preoperative study. Mild
inflammatory stranding in the retroperitoneal fat at the mesenteric
root in the region of the SMA is likely postsurgical. Mild
inflammatory stranding in the mesentery and omentum in the region of
the prior bowel resection. No significant ascites or evidence of
focal fluid collection to suggest abscess.

Musculoskeletal: No acute fracture or aggressive appearing lytic or
blastic osseous lesion.
IMPRESSION: VASCULAR

1. Significant interval improvement in the previously noted SMA
thrombosis compared to 01/06/2018. The main SMA is now widely patent
with evidence of recent open surgical thrombectomy and placement of
what appears to be a small jump bypass graft . One of the proximal
jejunal branches remains occluded but reconstitutes distally via
collateral flow.
2. Stable/similar appearance of nonocclusive thrombus in the distal
celiac artery extending into the proximal splenic artery. The
splenic and common hepatic arteries remain patent and well
opacified.
3.  Aortic Atherosclerosis ([id number]-[id number]).
4. Surgical changes of prior aortic valve replacement.

NON-VASCULAR

1. Expected surgical changes of distal small bowel resection and
right hemicolectomy. There are expected postoperative changes in the
associated mesenteries and adjacent omentum without evidence of
complication. Specifically, the enterocolonic anastomosis appears
widely patent, there is no evidence of obstruction, ileus, focal
bowel wall thickening or pneumatosis, and there is no significant
free fluid or evidence of intra-abdominal abscess.
2. Mild colonic and duodenal diverticulosis without evidence of
active diverticulitis.
3. Healing midline surgical incision.
4. Unchanged omental fat containing umbilical hernia compared to the
preoperative study.
5. Stable splenic infarct.  No evidence of new insult.
6. Low inspiratory volumes with scattered subsegmental atelectasis
and trace bilateral pleural effusions.
7. Stable cardiomegaly.
